# Patient Record
Sex: Female | Born: 1960 | Race: White | Hispanic: No | Smoking: Never smoker
Health system: Southern US, Community
[De-identification: ages and names within clinical notes are randomized; demographics above are authoritative.]

## PROBLEM LIST (undated history)

## (undated) DIAGNOSIS — IMO0002 Reserved for concepts with insufficient information to code with codable children: Secondary | ICD-10-CM

## (undated) DIAGNOSIS — N644 Mastodynia: Secondary | ICD-10-CM

## (undated) DIAGNOSIS — F329 Major depressive disorder, single episode, unspecified: Secondary | ICD-10-CM

## (undated) DIAGNOSIS — E785 Hyperlipidemia, unspecified: Secondary | ICD-10-CM

## (undated) DIAGNOSIS — F32A Depression, unspecified: Secondary | ICD-10-CM

## (undated) DIAGNOSIS — N289 Disorder of kidney and ureter, unspecified: Secondary | ICD-10-CM

## (undated) DIAGNOSIS — I639 Cerebral infarction, unspecified: Secondary | ICD-10-CM

## (undated) DIAGNOSIS — N393 Stress incontinence (female) (male): Secondary | ICD-10-CM

## (undated) DIAGNOSIS — N816 Rectocele: Secondary | ICD-10-CM

## (undated) DIAGNOSIS — C801 Malignant (primary) neoplasm, unspecified: Secondary | ICD-10-CM

## (undated) DIAGNOSIS — G4700 Insomnia, unspecified: Secondary | ICD-10-CM

## (undated) HISTORY — PX: VAGINAL HYSTERECTOMY: SUR661

## (undated) HISTORY — DX: Major depressive disorder, single episode, unspecified: F32.9

## (undated) HISTORY — DX: Depression, unspecified: F32.A

## (undated) HISTORY — DX: Mastodynia: N64.4

## (undated) HISTORY — PX: CYSTECTOMY: SUR359

## (undated) HISTORY — DX: Hyperlipidemia, unspecified: E78.5

## (undated) HISTORY — PX: KNEE SURGERY: SHX244

## (undated) HISTORY — DX: Stress incontinence (female) (male): N39.3

## (undated) HISTORY — DX: Insomnia, unspecified: G47.00

## (undated) HISTORY — DX: Rectocele: N81.6

## (undated) HISTORY — PX: REDUCTION MAMMAPLASTY: SUR839

## (undated) HISTORY — PX: ANTERIOR AND POSTERIOR VAGINAL REPAIR: SUR5

## (undated) HISTORY — PX: PUBOVAGINAL SLING: SHX1035

## (undated) HISTORY — PX: VULVA /PERINEUM BIOPSY: SHX319

## (undated) HISTORY — PX: SHOULDER SURGERY: SHX246

## (undated) HISTORY — PX: BACK SURGERY: SHX140

## (undated) HISTORY — DX: Reserved for concepts with insufficient information to code with codable children: IMO0002

## (undated) HISTORY — DX: Cerebral infarction, unspecified: I63.9

---

## 2004-09-04 ENCOUNTER — Emergency Department: Payer: Self-pay | Admitting: General Practice

## 2007-08-18 ENCOUNTER — Other Ambulatory Visit: Payer: Self-pay

## 2007-08-18 ENCOUNTER — Emergency Department: Payer: Self-pay | Admitting: Emergency Medicine

## 2008-03-18 ENCOUNTER — Other Ambulatory Visit (HOSPITAL_COMMUNITY): Admission: RE | Admit: 2008-03-18 | Discharge: 2008-04-03 | Payer: Self-pay | Admitting: Psychiatry

## 2008-03-20 ENCOUNTER — Ambulatory Visit: Payer: Self-pay | Admitting: Psychiatry

## 2008-04-04 ENCOUNTER — Ambulatory Visit (HOSPITAL_COMMUNITY): Payer: Self-pay | Admitting: Psychiatry

## 2008-04-24 ENCOUNTER — Ambulatory Visit (HOSPITAL_COMMUNITY): Payer: Self-pay | Admitting: Psychiatry

## 2008-05-08 ENCOUNTER — Ambulatory Visit (HOSPITAL_COMMUNITY): Payer: Self-pay | Admitting: Psychiatry

## 2010-07-29 ENCOUNTER — Ambulatory Visit: Payer: Self-pay | Admitting: Family Medicine

## 2011-02-15 ENCOUNTER — Ambulatory Visit: Payer: Self-pay | Admitting: General Surgery

## 2011-02-17 LAB — PATHOLOGY REPORT

## 2011-06-12 ENCOUNTER — Emergency Department: Payer: Self-pay | Admitting: *Deleted

## 2012-04-26 ENCOUNTER — Ambulatory Visit: Payer: Self-pay | Admitting: Gastroenterology

## 2013-05-23 DIAGNOSIS — R339 Retention of urine, unspecified: Secondary | ICD-10-CM | POA: Insufficient documentation

## 2013-05-23 DIAGNOSIS — N393 Stress incontinence (female) (male): Secondary | ICD-10-CM | POA: Insufficient documentation

## 2013-05-23 DIAGNOSIS — R351 Nocturia: Secondary | ICD-10-CM | POA: Insufficient documentation

## 2013-05-30 ENCOUNTER — Ambulatory Visit: Payer: Self-pay | Admitting: Urology

## 2013-05-30 LAB — BASIC METABOLIC PANEL
Anion Gap: 5 — ABNORMAL LOW (ref 7–16)
BUN: 23 mg/dL — ABNORMAL HIGH (ref 7–18)
CHLORIDE: 104 mmol/L (ref 98–107)
CREATININE: 1 mg/dL (ref 0.60–1.30)
Calcium, Total: 9.2 mg/dL (ref 8.5–10.1)
Co2: 28 mmol/L (ref 21–32)
EGFR (African American): >60
Glucose: 88 mg/dL (ref 65–99)
Osmolality: 277 (ref 275–301)
POTASSIUM: 3.9 mmol/L (ref 3.5–5.1)
SODIUM: 137 mmol/L (ref 136–145)

## 2013-05-30 LAB — CBC
HCT: 40 % (ref 35.0–47.0)
HGB: 13.5 g/dL (ref 12.0–16.0)
MCH: 32.1 pg (ref 26.0–34.0)
MCHC: 33.6 g/dL (ref 32.0–36.0)
MCV: 96 fL (ref 80–100)
PLATELETS: 407 10*3/uL (ref 150–440)
RBC: 4.19 10*6/uL (ref 3.80–5.20)
RDW: 14.1 % (ref 11.5–14.5)
WBC: 8.4 10*3/uL (ref 3.6–11.0)

## 2013-06-05 ENCOUNTER — Ambulatory Visit: Payer: Self-pay | Admitting: Urology

## 2013-06-06 LAB — HEMOGLOBIN: HGB: 11.7 g/dL — AB (ref 12.0–16.0)

## 2013-06-08 LAB — PATHOLOGY REPORT

## 2013-07-18 DIAGNOSIS — E785 Hyperlipidemia, unspecified: Secondary | ICD-10-CM | POA: Insufficient documentation

## 2013-07-18 DIAGNOSIS — F329 Major depressive disorder, single episode, unspecified: Secondary | ICD-10-CM | POA: Insufficient documentation

## 2013-07-18 DIAGNOSIS — F32A Depression, unspecified: Secondary | ICD-10-CM | POA: Insufficient documentation

## 2013-11-08 DIAGNOSIS — C801 Malignant (primary) neoplasm, unspecified: Secondary | ICD-10-CM

## 2013-11-08 HISTORY — DX: Malignant (primary) neoplasm, unspecified: C80.1

## 2014-06-01 NOTE — Op Note (Signed)
PATIENT NAME:  Felicia Cantu, DECAIRE MR#:  [id number] DATE OF BIRTH:  1960/05/19  DATE OF PROCEDURE:  06/05/2013  PREOPERATIVE DIAGNOSES: 1.  Pelvic organ prolapse.  2.  Stress urinary incontinence.   POSTOPERATIVE DIAGNOSES: 1.  Rectocele.  2.  Stress urinary incontinence.   OPERATIVE PROCEDURE:   1.  Transvaginal hysterectomy, left salpingo-oophorectomy and right salpingectomy.  2.  Pubovaginal sling with cystoscopy.   SURGEON:  Dr. Enzo Bi.   FIRST ASSISTANT:  Dr. Jacqlyn Larsen.   SECOND ASSISTANT:  Lucile Shutters, PA-S.    ANESTHESIA:  General endotracheal.   INDICATIONS:  The patient is a 54 year old white female with pelvic organ prolapse and stress urinary incontinence who presents for definitive surgery.   FINDINGS AT SURGERY:  Revealed a large rectocele, mild uterine prolapse and minimal cystocele. The ovaries appeared normal.   DESCRIPTION OF PROCEDURE:  The patient was brought to the operating room where she was placed in the supine position. General endotracheal anesthesia was induced without difficulty. She was placed in the dorsal lithotomy position using the candy cane stirrups. A Betadine perineal, intravaginal prep and drape was performed in standard fashion. A Foley catheter was placed and was draining clear yellow urine from the bladder. After completing the timeout, the procedure was started. A weighted speculum was placed into the vagina. A double-tooth tenaculum was placed onto the cervix. Posterior colpotomy was made with Mayo scissors. Uterosacral ligaments were clamped, cut and stick tied using 0 Vicryl suture. The cervix was circumscribed with the Bovie cautery. The bladder was dissected off the lower uterine segment through sharp and blunt dissection. Sequentially, the cardinal broad ligament complexes were then clamped, cut and stick tied up to the level of the utero-ovarian ligaments. These were then crossclamped, cut and stick tied. The specimen was removed from the  operative field. The fallopian tubes were then identified with a mesosalpinx on the right fallopian tube crossclamped and cut. A suture ligature was placed for hemostasis. The left fallopian tube was poorly  mobilized and in the process of isolating the structure was avulsed. A clamp was placed across the mesosalpinx and suture ligature was placed. However, some persistent bleeding was noted in the vasculature and eventually decision was made remove the left ovary in order to facilitate hemostasis. The infundibulopelvic ligament was isolated, crossclamped and the ovary was excised. The pedicle was suture ligated using 0 Vicryl stitch. An additional free tie was also placed in order to help to optimize hemostasis. Within the oozing mesosalpinx region on the left, a running locking stitch of 0 Vicryl was also used to help facilitate hemostasis. Once satisfied with hemostasis, the peritoneum was reapproximated using a pursestring stitch of 0 Vicryl.   The pubovaginal sling was then performed. Please see Dr. Aaron Edelman Cope's note for details of the sling procedure and cystoscopy.   Next, the rectocele was repaired in standard fashion. Two Allis clamps were used to place at the angles of the introitus. A diamond-shaped wedge of tissue was removed from the perineum and introitus. This was followed by undermining of the vaginal mucosa posteriorly with Metzenbaum scissors with subsequent incising of the vagina in the midline. Allis-Adair retractors were used to facilitate exposure. The perirectal fascia was dissected off of the vagina through sharp and blunt dissection. Once adequately mobilized, the rectocele was reduced using horizontal mattress sutures of 0 Vicryl. A good shelf was created within the overlying plicated levator complexes.  Following this, the vagina was trimmed and then reapproximated in the midline using simple  interrupted sutures of 2-0 chromic. Once the posterior colporrhaphy was completed, the  remainder of the cystoscopy was performed by Dr. Jacqlyn Larsen. This was followed by placing a vaginal pack within the vagina. Upon completion of the procedure, all instrumentation was removed. The patient was awakened, extubated and taken to the recovery room in satisfactory condition. Estimated blood loss was 450 mL.  IV fluids were 1400 mL.  Urine was not quantified because of the cystoscopy procedures. The patient did receive gentamicin and clindamycin antibiotic prophylaxis. All instruments, needle and sponge counts were verified as correct.     ____________________________ Alanda Slim. Devyn Sheerin, MD mad:dmm D: 06/05/2013 21:30:03 ET T: 06/05/2013 22:13:02 ET JOB#: [id number]  cc: Hassell Done A. Tinya Cadogan, MD, <Dictator> [facility] MD ELECTRONICALLY SIGNED 06/12/2013 13:43

## 2014-06-01 NOTE — Op Note (Signed)
PATIENT NAME:  Felicia Cantu, Felicia Cantu MR#:  [id number] DATE OF BIRTH:  Jun 24, 1960  DATE OF PROCEDURE:  06/05/2013  PRINCIPAL DIAGNOSIS:  Stress urinary incontinence.   POSTOPERATIVE DIAGNOSIS: Stress urinary incontinence.   PROCEDURE: Pubovaginal sling.   SURGEON: Edrick Oh, M.D.   ASSISTANT: Malachi Paradise, M.D.   ANESTHESIA: General endotracheal anesthesia.   INDICATIONS FOR PROCEDURE: The patient is a 54 year old female with a history of stress component urinary incontinence. She is scheduled to undergo a vaginal hysterectomy and rectocele repair. We have elected to proceed with concurrent surgery for correction of the stress component incontinence. She presents today for this purpose.   DESCRIPTION OF PROCEDURE: After informed consent was obtained, the patient was taken to the operating room and placed in the dorsal lithotomy position under general endotracheal anesthesia. The patient was then prepped and draped in the usual standard fashion. She underwent the vaginal hysterectomy with bilateral salpingo-oophorectomy by Dr. Enzo Bi with my assistance. At the completion of the procedure, a small anterior transverse vaginal wall incision was made at the level of the mid urethra. The vaginal mucosa was dissected off of the underlying tissue. This was taken down to the endopelvic fascia bilaterally. The pubic bone could be easily identified. Marks were made at the lateral aspect of the symphysis bilaterally. Small skin incisions were made at this level. A Supris  needle was passed through the skin incision. It was advanced to the level of the pubic bone. The back edge of the pubic bone was identified. The fascia was entered. The needle was advanced along the posterior aspect of the symphysis to the vaginal incision site. This was passed with finger guidance. The needle was easily placed into position. It was brought through the vaginal incision site. The left needle was similarly placed. With  the needles in place, the segment of mesh was secured to the needles. The needles were then withdrawn to the anterior abdominal wall. The mesh was centered over the mid urethra. It was secured at the 6 o'clock and 12 o'clock positions with interrupted 4-0 Vicryl suture. The vaginal incision was then closed. Cystoscopy was performed with a 22-French rigid cystoscope. This demonstrated no significant urethral abnormalities. There was no evidence of any needle or mesh passage into the urinary bladder. Clear efflux was noted from both ureteral orifices. No other significant mucosal abnormalities were appreciated. A Foley catheter was replaced to gravity drainage for the remainder of the procedure. The vaginal cuff from the hysterectomy and the posterior repair were then performed, also under the direction of Dr. Enzo Bi. Upon completion of those procedures, the Foley catheter was removed. Cystoscopy was once again performed with a 22-French rigid cystoscope. The bladder was filled to approximately 500 mL. The scope was withdrawn with slight leakage noted. Gentle pressure was applied to the segment of mesh with complete resolution of the leakage. The scope was replaced. There was no evidence of any kinking or significant compression of the urethra. Gentle pressure was placed on the scope with slight withdrawal of the mesh material. This once again resulted in slight leakage. The mesh was elevated just to the point of minimal trickle. The edges were trimmed free at the level of the skin. Dermabond was applied to the skin incision sites. The Foley catheter was replaced to gravity drainage. A vaginal packing was placed. The patient was then returned to the supine position. She was awakened from general endotracheal anesthesia. She was taken to the recovery room in stable condition. There were no problems  or complications. The patient tolerated the procedure well.   ESTIMATED BLOOD LOSS: Minimal from the sling  standpoint. The other blood loss will be mentioned under the hysterectomy and rectocele dictation.   ____________________________ Felicia Bors Jacqlyn Larsen, MD bsc:ce D: 06/06/2013 13:27:07 ET T: 06/06/2013 15:22:33 ET JOB#: [id number]  cc: Felicia Bors. Jacqlyn Larsen, MD, <Dictator> Alanda Slim. DeFrancesco, MD Felicia Bors Burnett Spray MD ELECTRONICALLY SIGNED 06/14/2013 20:47

## 2014-06-22 ENCOUNTER — Encounter: Payer: Self-pay | Admitting: Emergency Medicine

## 2014-06-22 ENCOUNTER — Emergency Department
Admission: EM | Admit: 2014-06-22 | Discharge: 2014-06-22 | Disposition: A | Discharge status: Home / Self Care | Payer: BLUE CROSS/BLUE SHIELD | Attending: Emergency Medicine | Admitting: Emergency Medicine

## 2014-06-22 DIAGNOSIS — R319 Hematuria, unspecified: Secondary | ICD-10-CM | POA: Diagnosis present

## 2014-06-22 DIAGNOSIS — N3000 Acute cystitis without hematuria: Secondary | ICD-10-CM | POA: Diagnosis not present

## 2014-06-22 DIAGNOSIS — Z880 Allergy status to penicillin: Secondary | ICD-10-CM | POA: Diagnosis not present

## 2014-06-22 HISTORY — DX: Disorder of kidney and ureter, unspecified: N28.9

## 2014-06-22 HISTORY — DX: Malignant (primary) neoplasm, unspecified: C80.1

## 2014-06-22 LAB — URINALYSIS COMPLETE WITH MICROSCOPIC (ARMC ONLY)
BILIRUBIN URINE: NEGATIVE
GLUCOSE, UA: NEGATIVE mg/dL
HGB URINE DIPSTICK: NEGATIVE
KETONES UR: NEGATIVE mg/dL
Nitrite: NEGATIVE
PROTEIN: NEGATIVE mg/dL
Specific Gravity, Urine: 1.003 — ABNORMAL LOW (ref 1.005–1.030)
pH: 6 (ref 5.0–8.0)

## 2014-06-22 MED ORDER — SULFAMETHOXAZOLE-TRIMETHOPRIM 800-160 MG PO TABS: 1.0000 | ORAL_TABLET | Freq: Two times a day (BID) | ORAL | Status: DC

## 2014-06-22 MED ORDER — PHENAZOPYRIDINE HCL 200 MG PO TABS
200.0000 mg | ORAL_TABLET | Freq: Once | ORAL | Status: AC
  Administered 2014-06-22: 200 mg via ORAL

## 2014-06-22 MED ORDER — PHENAZOPYRIDINE HCL 200 MG PO TABS: 200.0000 mg | ORAL_TABLET | Freq: Three times a day (TID) | ORAL | Status: DC | PRN

## 2014-06-22 MED ORDER — KETOROLAC TROMETHAMINE 10 MG PO TABS: 10.0000 mg | ORAL_TABLET | Freq: Four times a day (QID) | ORAL | Status: DC | PRN

## 2014-06-22 MED ORDER — KETOROLAC TROMETHAMINE 10 MG PO TABS
ORAL_TABLET | ORAL | Status: AC
  Administered 2014-06-22: 10 mg via ORAL
  Filled 2014-06-22: qty 1

## 2014-06-22 MED ORDER — SULFAMETHOXAZOLE-TRIMETHOPRIM 800-160 MG PO TABS
ORAL_TABLET | ORAL | Status: AC
  Administered 2014-06-22: 1 via ORAL
  Filled 2014-06-22: qty 1

## 2014-06-22 MED ORDER — SULFAMETHOXAZOLE-TRIMETHOPRIM 800-160 MG PO TABS
1.0000 | ORAL_TABLET | Freq: Once | ORAL | Status: AC
  Administered 2014-06-22: 1 via ORAL

## 2014-06-22 MED ORDER — PHENAZOPYRIDINE HCL 200 MG PO TABS
ORAL_TABLET | ORAL | Status: AC
  Administered 2014-06-22: 200 mg via ORAL
  Filled 2014-06-22: qty 1

## 2014-06-22 MED ORDER — KETOROLAC TROMETHAMINE 10 MG PO TABS
10.0000 mg | ORAL_TABLET | Freq: Once | ORAL | Status: AC
  Administered 2014-06-22: 10 mg via ORAL

## 2014-06-22 NOTE — ED Provider Notes (Signed)
[facility] Emergency Department Provider Note ____________________________________________  Time seen: 72  I have reviewed the triage vital signs and the nursing notes.   HISTORY  Chief Complaint Hematuria   HPI Felicia Cantu is a 54 y.o. female complaining of urinary frequency and burning pain and some blood in her urine states that she has a history of urinary tract infections she is been drinking plenty of fluids and cranberry juice to try to get it out but is still symptomatic and is here today for evaluation because she believes that she needs antibiotics rates the pain at its worse is a 10 out of 10 currently not so bad with nothing really factoring in his pain comes and goes denies any nausea fevers chills vomiting any bowel complaints complaints or vaginal issues   Past Medical History  Diagnosis Date  . Renal disorder   . 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   Past Surgical History  Procedure Laterality Date  . Abdominal hysterectomy    . Vulva /perineum biopsy    . Cystectomy      Current Outpatient Rx  Name  Route  Sig  Dispense  Refill  . ketorolac (TORADOL) 10 MG tablet   Oral   Take 1 tablet (10 mg total) by mouth every 6 (six) hours as needed.   20 tablet   0   . phenazopyridine (PYRIDIUM) 200 MG tablet   Oral   Take 1 tablet (200 mg total) by mouth 3 (three) times daily as needed for pain.   10 tablet   0   . sulfamethoxazole-trimethoprim (BACTRIM DS,SEPTRA DS) 800-160 MG per tablet   Oral   Take 1 tablet by mouth 2 (two) times daily.   10 tablet   0     Allergies Codeine; Penicillins; and Vicodin  History reviewed. No pertinent family history.  Social History History  Substance Use Topics  . Smoking status: Never Smoker   . Smokeless tobacco: Never Used  . Alcohol Use: No    Review of Systems Constitutional: No fever/chills Eyes: No  visual changes. ENT: No sore throat. Cardiovascular: Denies chest pain. Respiratory: Denies shortness of breath. Gastrointestinal: No abdominal pain.  No nausea, no vomiting.  No diarrhea.  No constipation. Genitourinary: Negative for dysuria. Musculoskeletal: Negative for back pain. Skin: Negative for rash. Neurological: Negative for headaches, focal weakness or numbness.  10-point ROS otherwise negative.  ____________________________________________   PHYSICAL EXAM:  VITAL SIGNS: ED Triage Vitals  Enc Vitals Group     BP 06/22/14 1807 132/106 mmHg     Pulse Rate 06/22/14 1807 73     Resp 06/22/14 1807 16     Temp 06/22/14 1807 98.2 F (36.8 C)     Temp Source 06/22/14 1807 Oral     SpO2 06/22/14 1807 99 %     Weight 06/22/14 1807 192 lb (87.091 kg)     Height 06/22/14 1807 5\' 8"  (1.727 m)     Head Cir --      Peak Flow --      Pain Score 06/22/14 1808 10     Pain Loc --      Pain Edu? --      Excl. in [place]? --     Constitutional: Alert and oriented. Well appearing and in no acute distress. Eyes: Conjunctivae are normal. PERRL. EOMI. Head: Atraumatic. Nose: No congestion/rhinnorhea. Mouth/Throat: Mucous membranes are  moist.  Oropharynx non-erythematous. Neck: No stridor.   Cardiovascular: Normal rate, regular rhythm. Grossly normal heart sounds.  Good peripheral circulation. Respiratory: Normal respiratory effort.  No retractions. Lungs CTAB. Gastrointestinal: Mild suprapubic tenderness Musculoskeletal: No lower extremity tenderness nor edema.  No joint effusions. Neurologic:  Normal speech and language. No gross focal neurologic deficits are appreciated. Speech is normal. No gait instability. Skin:  Skin is warm, dry and intact. No rash noted. Psychiatric: Mood and affect are normal. Speech and behavior are normal.  ____________________________________________   LABS (all labs ordered are listed, but only abnormal results are displayed)  Labs Reviewed   URINALYSIS COMPLETEWITH MICROSCOPIC ([place])  - Abnormal; Notable for the following:    Color, Urine STRAW (*)    APPearance CLEAR (*)    Specific Gravity, Urine 1.003 (*)    Leukocytes, UA 1+ (*)    Bacteria, UA RARE (*)    Squamous Epithelial / LPF 0-5 (*)    All other components within normal limits      PROCEDURES  Procedure(s) performed: None  Critical Care performed: No  ____________________________________________   INITIAL IMPRESSION / ASSESSMENT AND PLAN / ED COURSE  Pertinent labs & imaging results that were available during my care of the patient were reviewed by me and considered in my medical decision making (see chart for details).  Urinary tract infection given the patient's description of symptoms or history of having them before the fact that she is then vigorously drinking a lot of water we'll go ahead and cover with antibiotics Pyridium and follow-up with her doctor return here for any acute concerns or worsening symptoms ____________________________________________   FINAL CLINICAL IMPRESSION(S) / ED DIAGNOSES  Final diagnoses:  Acute cystitis without hematuria     Evangela Heffler Verdene Rio, PA-C 06/22/14 2005  Delman Kitten, MD 06/22/14 [phone]

## 2014-06-22 NOTE — Discharge Instructions (Signed)

## 2014-06-22 NOTE — ED Notes (Signed)
Pt c/o of heavy bleeding in urine since Tuesday morning, including clots.  She has bladder pain and burning upon urination.  Pain is rated as 10/10 and described as "fiery darts of hell".  She has taken cranberry supplements but no OTC or prescription pain medications.  She states that she has had several bladder infections in the past and this seems like the same.

## 2014-11-06 ENCOUNTER — Encounter: Payer: Self-pay | Admitting: Obstetrics and Gynecology

## 2014-11-20 ENCOUNTER — Encounter: Payer: BLUE CROSS/BLUE SHIELD | Admitting: Obstetrics and Gynecology

## 2015-01-29 ENCOUNTER — Encounter: Payer: Self-pay | Admitting: Obstetrics and Gynecology

## 2015-01-29 ENCOUNTER — Ambulatory Visit (INDEPENDENT_AMBULATORY_CARE_PROVIDER_SITE_OTHER): Payer: BLUE CROSS/BLUE SHIELD | Admitting: Obstetrics and Gynecology

## 2015-01-29 VITALS — BP 106/70 | HR 89 | Ht 68.0 in | Wt 189.4 lb

## 2015-01-29 DIAGNOSIS — Z1211 Encounter for screening for malignant neoplasm of colon: Secondary | ICD-10-CM

## 2015-01-29 DIAGNOSIS — Z0000 Encounter for general adult medical examination without abnormal findings: Secondary | ICD-10-CM

## 2015-01-29 DIAGNOSIS — Z90710 Acquired absence of both cervix and uterus: Secondary | ICD-10-CM

## 2015-01-29 DIAGNOSIS — Z1231 Encounter for screening mammogram for malignant neoplasm of breast: Secondary | ICD-10-CM

## 2015-01-29 DIAGNOSIS — Z01419 Encounter for gynecological examination (general) (routine) without abnormal findings: Secondary | ICD-10-CM

## 2015-01-29 NOTE — Progress Notes (Signed)
Patient ID: Felicia Cantu, female   DOB: 07/05/1960, 54 y.o.   MRN: [id number] ANNUAL PREVENTATIVE CARE GYN  ENCOUNTER NOTE  Subjective:       Felicia Cantu is a 54 y.o. G1P0 female here for a routine annual gynecologic exam.  Current complaints: 1.  none    Gynecologic History No LMP recorded. Patient has had a hysterectomy. Contraception: status post hysterectomy TVH bilateral salpingectomy Last Pap: 2015. Results were: asus/neg Last mammogram: 2015. Results were: normal Status post anterior colporrhaphy. Status post posterior colporrhaphy. Status post pubovaginal sling (Dr. Jacqlyn Larsen). Long history of chronic left breast tenderness; previous biopsy negative. History of SUI, minimally dramatic now. Bowel function normal  Obstetric History OB History  Gravida Para Term Preterm AB SAB TAB Ectopic Multiple Living  1             # Outcome Date GA Lbr Len/2nd Weight Sex Delivery Anes PTL Lv  1 Gravida               Past Medical History  Diagnosis Date  . Renal disorder   . Depression   . Positional vertigo   . Hyperlipemia   . Insomnia   . Breast tenderness   . Rectocele   . SUI (stress urinary incontinence, female)   . 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 Vulva /perineum biopsy    . Cystectomy    . Pubovaginal sling    . Vaginal hysterectomy      tvh lso rt salpingectomy   . Anterior and posterior vaginal repair    . Shoulder surgery    . Knee surgery    . Back surgery      No current outpatient prescriptions on file prior to visit.   No current facility-administered medications on file prior to visit.    Allergies  Allergen Reactions  . Codeine Nausea And Vomiting  . Penicillins Nausea And Vomiting  . Vicodin [Hydrocodone-Acetaminophen] Nausea And Vomiting  . Propoxyphene Nausea And Vomiting and Nausea Only    Social History   Social History  . Marital Status: Divorced   Spouse Name: N/A  . Number of Children: N/A  . Years of Education: N/A   Occupational History  . Not on file.   Social History Main Topics  . Smoking status: Never Smoker   . Smokeless tobacco: Never Used  . Alcohol Use: No  . Drug Use: No  . Sexual Activity: Not Currently    Birth Control/ Protection: Surgical   Other Topics Concern  . Not on file   Social History Narrative    Family History  Problem Relation Age of Onset  . Diabetes Mother   . Heart disease Father   . Cancer Neg Hx     The following portions of the patient's history were reviewed and updated as appropriate: allergies, current medications, past family history, past medical history, past social history, past surgical history and problem list.  Review of Systems ROS Review of Systems - General ROS: negative for - chills, fatigue, fever, hot flashes, night sweats, weight gain or weight loss Psychological ROS: negative for - anxiety, decreased libido, depression, mood swings, physical abuse or sexual abuse Ophthalmic ROS: negative for - blurry vision, eye pain or loss of vision ENT ROS: negative for - headaches, hearing change, visual changes or vocal changes Allergy and Immunology ROS: negative for - hives, itchy/watery eyes or seasonal allergies  Hematological and Lymphatic ROS: negative for - bleeding problems, bruising, swollen lymph nodes or weight loss Endocrine ROS: negative for - galactorrhea, hair pattern changes, hot flashes, malaise/lethargy, mood swings, palpitations, polydipsia/polyuria, skin changes, temperature intolerance or unexpected weight changes Breast ROS: negative for - new or changing breast lumps or nipple discharge Respiratory ROS: negative for - cough or shortness of breath Cardiovascular ROS: negative for - chest pain, irregular heartbeat, palpitations or shortness of breath Gastrointestinal ROS: no abdominal pain, change in bowel habits, or black or bloody stools Genito-Urinary ROS:  no dysuria, trouble voiding, or hematuria Musculoskeletal ROS: negative for - joint pain or joint stiffness Neurological ROS: negative for - bowel and bladder control changes Dermatological ROS: negative for rash and skin lesion changes   Objective:   BP 106/70 mmHg  Pulse 89  Ht 5\' 8"  (1.727 m)  Wt 189 lb 6.4 oz (85.911 kg)  BMI 28.80 kg/m2 CONSTITUTIONAL: Well-developed, well-nourished female in no acute distress.  PSYCHIATRIC: Normal mood and affect. Normal behavior. Normal judgment and thought content. [place]: Alert and oriented to person, place, and time. Normal muscle tone coordination. No cranial nerve deficit noted. HENT:  Normocephalic, atraumatic, External right and left ear normal. Oropharynx is clear and moist EYES: Conjunctivae and EOM are normal. Pupils are equal, round, and reactive to light. No scleral icterus.  NECK: Normal range of motion, supple, no masses.  Normal thyroid.  SKIN: Skin is warm and dry. No rash noted. Not diaphoretic. No erythema. No pallor. CARDIOVASCULAR: Normal heart rate noted, regular rhythm, no murmur. RESPIRATORY: Clear to auscultation bilaterally. Effort and breath sounds normal, no problems with respiration noted. BREASTS: Symmetric in size. No masses, skin changes, nipple drainage, or lymphadenopathy. Left breast tenderness upper outer quadrant, without focal findings ABDOMEN: Soft, normal bowel sounds, no distention noted.  No tenderness, rebound or guarding.  BLADDER: Normal PELVIC:  External Genitalia: Normal  BUS: Normal  Vagina: Normal  Cervix:surgically absent  Uterus: surgically absent  Adnexa: Normal  RV: External Exam NormaI, No Rectal Masses and Normal Sphincter tone  MUSCULOSKELETAL: Normal range of motion. No tenderness.  No cyanosis, clubbing, or edema.  2+ distal pulses. LYMPHATIC: No Axillary, Supraclavicular, or Inguinal Adenopathy.    Assessment:   Annual gynecologic examination 54 y.o. Contraception: status post  hysterectomy TVH, bilateral salpingectomy. Status post anterior posterior colporrhaphy. Status post pubovaginal sling bmi- 28   Plan:  Pap: Not needed Mammogram: Ordered Stool Guaiac Testing:  Ordered Labs: thur pcp Routine preventative health maintenance measures emphasized: Exercise/Diet/Weight control, Tobacco Warnings and Alcohol/Substance use risks Return to [place], CMA  Brayton Mars, MD  Note: This dictation was prepared with Dragon dictation along with smaller phrase technology. Any transcriptional errors that result from this process are unintentional.

## 2015-01-29 NOTE — Patient Instructions (Signed)
No Pap smear needed. Mammogram ordered. Stool guaiac cards given for colon cancer screening. Return in 1 year or when necessary

## 2015-03-21 ENCOUNTER — Encounter: Payer: Self-pay | Admitting: Surgery

## 2015-03-21 ENCOUNTER — Ambulatory Visit (INDEPENDENT_AMBULATORY_CARE_PROVIDER_SITE_OTHER): Payer: BLUE CROSS/BLUE SHIELD | Admitting: Surgery

## 2015-03-21 VITALS — BP 143/91 | HR 70 | Temp 98.3°F | Wt 198.0 lb

## 2015-03-21 DIAGNOSIS — N6320 Unspecified lump in the left breast, unspecified quadrant: Secondary | ICD-10-CM

## 2015-03-21 DIAGNOSIS — N644 Mastodynia: Secondary | ICD-10-CM | POA: Diagnosis not present

## 2015-03-21 DIAGNOSIS — N62 Hypertrophy of breast: Secondary | ICD-10-CM | POA: Diagnosis not present

## 2015-03-21 DIAGNOSIS — N63 Unspecified lump in breast: Secondary | ICD-10-CM

## 2015-03-21 NOTE — Progress Notes (Signed)
Subjective:     Patient ID: Felicia Cantu, female   DOB: 13-Oct-1960, 55 y.o.   MRN: [id number]  HPI  55 year old female has a past medical history of hyperlipidemia but otherwise healthy comes in today with complaints of left breast pain which has been ongoing for the past 20 years. Patient states that one particular area on the left upper outer quadrant is painful and has been biopsied in the past and shown to be benign fibrocystic disease back in 2013. Patient does not have any family history of breast cancer although her father did have colon cancer and her mother had stomach cancer. As well as her sisters having some skin cancer. 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 Patient started her first menses at the age of 32 years she took birth control for about 15 years and then had her first child at the age of 64 and she did breast-feed. She has not had any hormone replacement therapy therapy but she has had a hysterectomy and her right ovary removed. Patient has not noticed any skin changes any nipple retraction nipple discharge or any other lumps or bumps in the area. Patient does note that she has gained some weight over the past 5 years approximately 40 pounds. Prior to this the patient had tried to get a breast reduction patient states that she has significant shoulder pain back pain from the size of her breast. 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   Past Medical History  Diagnosis Date  . Renal disorder   . Depression   . Positional vertigo   . Hyperlipemia   . Insomnia   . Breast tenderness   . Rectocele   . SUI (stress urinary incontinence, female)   . 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 Stroke [facility])    Past Surgical History  Procedure Laterality Date  . Vulva /perineum biopsy    . Cystectomy    . Pubovaginal sling    . Vaginal  hysterectomy      tvh lso rt salpingectomy   . Anterior and posterior vaginal repair    . Shoulder surgery    . Knee surgery    . Back surgery     Family History  Problem Relation Age of Onset  . Diabetes Mother   . Cancer Mother     skin cancer  . Heart disease Father   . Cancer Father     lung   Social History   Social History  . Marital Status: Divorced    Spouse Name: N/A  . Number of Children: N/A  . Years of Education: N/A   Social History Main Topics  . Smoking status: Never Smoker   . Smokeless tobacco: Never Used  . Alcohol Use: No  . Drug Use: No  . Sexual Activity: Not Currently    Birth Control/ Protection: Surgical   Other Topics Concern  . None   Social History Narrative    Current outpatient prescriptions:  .  aspirin 81 MG chewable tablet, Chew by mouth., Disp: , Rfl:  .  lovastatin (MEVACOR) 40 MG tablet, , Disp: , Rfl:  .  Melatonin 3 MG TABS, Take by mouth., Disp: , Rfl:  .  Multiple Vitamin (MULTI-VITAMINS) TABS, Take by mouth., Disp: , Rfl:  .  sertraline (ZOLOFT) 100 MG tablet, 200  mg. , Disp: , Rfl:  Allergies  Allergen Reactions  . Codeine Nausea And Vomiting  . Penicillins Nausea And Vomiting  . Vicodin [Hydrocodone-Acetaminophen] Nausea And Vomiting  . Propoxyphene Nausea And Vomiting and Nausea Only      Review of Systems  Constitutional: Negative for fever, activity change, appetite change and unexpected weight change.  HENT: Negative for congestion and sore throat.   Respiratory: Negative for cough, chest tightness, shortness of breath and wheezing.   Cardiovascular: Negative for chest pain, palpitations and leg swelling.  Gastrointestinal: Negative for nausea, vomiting, abdominal pain, diarrhea and anal bleeding.  Genitourinary: Negative for dysuria and hematuria.  Musculoskeletal: Positive for back pain, arthralgias and neck pain.  Skin: Negative for color change, pallor, rash and wound.  Neurological: Negative for  dizziness and weakness.  Hematological: Negative for adenopathy. Does not bruise/bleed easily.  Psychiatric/Behavioral: Negative for agitation. The patient is not nervous/anxious.        Filed Vitals:   03/21/15 1622  BP: 143/91  Pulse: 70  Temp: 98.3 F (36.8 C)    Objective:   Physical Exam  Constitutional: She appears well-developed and well-nourished. No distress.  HENT:  Head: Normocephalic and atraumatic.  Nose: Nose normal.  Mouth/Throat: Oropharynx is clear and moist. No oropharyngeal exudate.  Eyes: Conjunctivae and EOM are normal. Pupils are equal, round, and reactive to light. No scleral icterus.  Neck: Normal range of motion. Neck supple. No tracheal deviation present.  Cardiovascular: Normal rate, regular rhythm, normal heart sounds and intact distal pulses.  Exam reveals no gallop and no friction rub.   No murmur heard. Pulmonary/Chest: Effort normal and breath sounds normal. No respiratory distress. She has no wheezes. She has no rales.  Abdominal: Soft. Bowel sounds are normal. She exhibits no distension. There is no tenderness. There is no rebound.  Musculoskeletal: Normal range of motion. She exhibits no edema or tenderness.  Neurological: She is alert. No cranial nerve deficit.  Skin: Skin is warm and dry. No rash noted. No erythema. No pallor.  Psychiatric: She has a normal mood and affect. Her behavior is normal. Judgment and thought content normal.  Vitals reviewed.  Breast exam:   Right breast: very large F cup or larger breast which comes down below the umbilicus, with shoulder/clavicular grooving, dense breast tissue with multiple small areas of rubbery mobile tissues, likely fibrocystic disease.  No mass, skin changes, nipple retraction or lymphadenopathy.   Left breast: also very large F cup or larger which comes down below the umbilicus, with shoulder/clavicular grooving, dense breast tissue, multiple small areas of rubbery mobile tissues. Area in upper  outer quadrant approximately 1cm in size which is tender, feels like fibrocystic changes. No masses, skin changes, nipple retraction or lymphadenopathy.    Pathology report 05/2011: benign tissue with usual ductal hyperplasia no atypia or malignancy      Assessment:     55 yr old female with left breast pain and macromastomy     Plan:     I discussed with the female that given her past pathology and her clinical exam that I feel that she likely has fibrocystic disease which is causing some of her left breast pain. She is scheduled to have her mammogram in March which last year was a BI-RADS 2. 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 I will refer her to [place] department to consider her for a bilateral breast reduction as long as her mammogram comes back negative. If she does have some other findings on her mammogram will have her follow-up with [place] or if the plastics does not feel she is an appropriate candidate for reduction I'm happy to do excisional biopsy of the fibroadenoma.

## 2015-03-21 NOTE — Patient Instructions (Signed)
We will refer you to [facility]. They will contact you with date and time of your appointment.

## 2016-02-05 NOTE — Progress Notes (Deleted)
Patient ID: Felicia Cantu, female   DOB: [birth year], 55 y.o.   MRN: [id number] ANNUAL PREVENTATIVE CARE GYN  ENCOUNTER NOTE  Subjective:       Felicia Cantu is a 55 y.o. G1P0 female here for a routine annual gynecologic exam.  Current complaints: 1.  none    Gynecologic History No LMP recorded. Patient has had a hysterectomy. Contraception: status post hysterectomy TVH bilateral salpingectomy Last Pap: 2015. Results were: asus/neg Last mammogram: 2015. Results were: normal Status post anterior colporrhaphy. Status post posterior colporrhaphy. Status post pubovaginal sling (Dr. Jacqlyn Larsen). Long history of chronic left breast tenderness; previous biopsy negative. History of SUI, minimally dramatic now. Bowel function normal  Obstetric History OB History  Gravida Para Term Preterm AB Living  1            SAB TAB Ectopic Multiple Live Births               # Outcome Date GA Lbr Len/2nd Weight Sex Delivery Anes PTL Lv  1 Gravida               Past Medical History:  Diagnosis Date  . Breast tenderness   . 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 Depression   . Hyperlipemia   . Insomnia   . Positional vertigo   . Rectocele   . Renal disorder   . Stroke ([place])   . SUI (stress urinary incontinence, female)     Past Surgical History:  Procedure Laterality Date  . ANTERIOR AND POSTERIOR VAGINAL REPAIR    . BACK SURGERY    . CYSTECTOMY    . KNEE SURGERY    . PUBOVAGINAL SLING    . SHOULDER SURGERY    . VAGINAL HYSTERECTOMY     tvh lso rt salpingectomy   . VULVA /PERINEUM BIOPSY      Current Outpatient Prescriptions on File Prior to Visit  Medication Sig Dispense Refill  . aspirin 81 MG chewable tablet Chew by mouth.    . lovastatin (MEVACOR) 40 MG tablet     . Melatonin 3 MG TABS Take by mouth.    . Multiple Vitamin (MULTI-VITAMINS) TABS Take by mouth.    . sertraline (ZOLOFT) 100 MG tablet 200 mg.      No current facility-administered medications  on file prior to visit.     Allergies  Allergen Reactions  . Codeine Nausea And Vomiting  . Penicillins Nausea And Vomiting  . Vicodin [Hydrocodone-Acetaminophen] Nausea And Vomiting  . Propoxyphene Nausea And Vomiting and Nausea Only    Social History   Social History  . Marital status: Divorced    Spouse name: N/A  . Number of children: N/A  . Years of education: N/A   Occupational History  . Not on file.   Social History Main Topics  . Smoking status: Never Smoker  . Smokeless tobacco: Never Used  . Alcohol use No  . Drug use: No  . Sexual activity: Not Currently    Birth control/ protection: Surgical   Other Topics Concern  . Not on file   Social History Narrative  . No narrative on file    Family History  Problem Relation Age of Onset  . Diabetes Mother   . Cancer Mother     skin cancer  . Heart disease Father   . Cancer Father     lung    The following portions of the patient's history were reviewed and updated as appropriate:  allergies, current medications, past family history, past medical history, past social history, past surgical history and problem list.  Review of Systems ROS Review of Systems - General ROS: negative for - chills, fatigue, fever, hot flashes, night sweats, weight gain or weight loss Psychological ROS: negative for - anxiety, decreased libido, depression, mood swings, physical abuse or sexual abuse Ophthalmic ROS: negative for - blurry vision, eye pain or loss of vision ENT ROS: negative for - headaches, hearing change, visual changes or vocal changes Allergy and Immunology ROS: negative for - hives, itchy/watery eyes or seasonal allergies Hematological and Lymphatic ROS: negative for - bleeding problems, bruising, swollen lymph nodes or weight loss Endocrine ROS: negative for - galactorrhea, hair pattern changes, hot flashes, malaise/lethargy, mood swings, palpitations, polydipsia/polyuria, skin changes, temperature intolerance or  unexpected weight changes Breast ROS: negative for - new or changing breast lumps or nipple discharge Respiratory ROS: negative for - cough or shortness of breath Cardiovascular ROS: negative for - chest pain, irregular heartbeat, palpitations or shortness of breath Gastrointestinal ROS: no abdominal pain, change in bowel habits, or black or bloody stools Genito-Urinary ROS: no dysuria, trouble voiding, or hematuria Musculoskeletal ROS: negative for - joint pain or joint stiffness Neurological ROS: negative for - bowel and bladder control changes Dermatological ROS: negative for rash and skin lesion changes   Objective:   There were no vitals taken for this visit. CONSTITUTIONAL: Well-developed, well-nourished female in no acute distress.  PSYCHIATRIC: Normal mood and affect. Normal behavior. Normal judgment and thought content. [place]: Alert and oriented to person, place, and time. Normal muscle tone coordination. No cranial nerve deficit noted. HENT:  Normocephalic, atraumatic, External right and left ear normal. Oropharynx is clear and moist EYES: Conjunctivae and EOM are normal. Pupils are equal, round, and reactive to light. No scleral icterus.  NECK: Normal range of motion, supple, no masses.  Normal thyroid.  SKIN: Skin is warm and dry. No rash noted. Not diaphoretic. No erythema. No pallor. CARDIOVASCULAR: Normal heart rate noted, regular rhythm, no murmur. RESPIRATORY: Clear to auscultation bilaterally. Effort and breath sounds normal, no problems with respiration noted. BREASTS: Symmetric in size. No masses, skin changes, nipple drainage, or lymphadenopathy. Left breast tenderness upper outer quadrant, without focal findings ABDOMEN: Soft, normal bowel sounds, no distention noted.  No tenderness, rebound or guarding.  BLADDER: Normal PELVIC:  External Genitalia: Normal  BUS: Normal  Vagina: Normal  Cervix:surgically absent  Uterus: surgically absent  Adnexa: Normal  RV:  External Exam NormaI, No Rectal Masses and Normal Sphincter tone  MUSCULOSKELETAL: Normal range of motion. No tenderness.  No cyanosis, clubbing, or edema.  2+ distal pulses. LYMPHATIC: No Axillary, Supraclavicular, or Inguinal Adenopathy.    Assessment:   Annual gynecologic examination 55 y.o. Contraception: status post hysterectomy TVH, bilateral salpingectomy. Status post anterior posterior colporrhaphy. Status post pubovaginal sling bmi- 28   Plan:  Pap: Not needed Mammogram: Ordered Stool Guaiac Testing:  Ordered Labs: thur pcp Routine preventative health maintenance measures emphasized: Exercise/Diet/Weight control, Tobacco Warnings and Alcohol/Substance use risks Return to [place], [place]   Note: This dictation was prepared with Dragon dictation along with smaller phrase technology. Any transcriptional errors that result from this process are unintentional.

## 2016-02-11 ENCOUNTER — Encounter: Payer: BLUE CROSS/BLUE SHIELD | Admitting: Obstetrics and Gynecology

## 2017-07-15 ENCOUNTER — Other Ambulatory Visit: Payer: Self-pay | Admitting: Internal Medicine

## 2017-07-15 DIAGNOSIS — Z1239 Encounter for other screening for malignant neoplasm of breast: Secondary | ICD-10-CM

## 2020-04-19 ENCOUNTER — Other Ambulatory Visit: Payer: Self-pay

## 2020-04-19 ENCOUNTER — Emergency Department
Admission: EM | Admit: 2020-04-19 | Discharge: 2020-04-19 | Disposition: A | Discharge status: Home / Self Care | Payer: 59 | Attending: Emergency Medicine | Admitting: Emergency Medicine

## 2020-04-19 DIAGNOSIS — S81022A Laceration with foreign body, left knee, initial encounter: Secondary | ICD-10-CM | POA: Diagnosis not present

## 2020-04-19 DIAGNOSIS — Z23 Encounter for immunization: Secondary | ICD-10-CM | POA: Insufficient documentation

## 2020-04-19 DIAGNOSIS — R0902 Hypoxemia: Secondary | ICD-10-CM | POA: Diagnosis not present

## 2020-04-19 DIAGNOSIS — Z7982 Long term (current) use of aspirin: Secondary | ICD-10-CM | POA: Insufficient documentation

## 2020-04-19 DIAGNOSIS — W293XXA Contact with powered garden and outdoor hand tools and machinery, initial encounter: Secondary | ICD-10-CM | POA: Insufficient documentation

## 2020-04-19 DIAGNOSIS — Z85828 Personal history of other malignant neoplasm of skin: Secondary | ICD-10-CM | POA: Diagnosis not present

## 2020-04-19 DIAGNOSIS — S8992XA Unspecified injury of left lower leg, initial encounter: Secondary | ICD-10-CM | POA: Diagnosis present

## 2020-04-19 DIAGNOSIS — S81012A Laceration without foreign body, left knee, initial encounter: Secondary | ICD-10-CM | POA: Diagnosis not present

## 2020-04-19 DIAGNOSIS — R58 Hemorrhage, not elsewhere classified: Secondary | ICD-10-CM | POA: Diagnosis not present

## 2020-04-19 MED ORDER — NAPROXEN 500 MG PO TABS: 500.0000 mg | ORAL_TABLET | Freq: Two times a day (BID) | ORAL | Status: AC

## 2020-04-19 MED ORDER — SULFAMETHOXAZOLE-TRIMETHOPRIM 800-160 MG PO TABS: 1.0000 | ORAL_TABLET | Freq: Two times a day (BID) | ORAL | 0 refills | Status: AC

## 2020-04-19 MED ORDER — LIDOCAINE-EPINEPHRINE 2 %-1:100000 IJ SOLN
30.0000 mL | Freq: Once | INTRAMUSCULAR | Status: AC
  Administered 2020-04-19: 30 mL
  Filled 2020-04-19 (×2): qty 30
  Filled 2020-04-19: qty 2

## 2020-04-19 MED ORDER — TETANUS-DIPHTH-ACELL PERTUSSIS 5-2.5-18.5 LF-MCG/0.5 IM SUSY
0.5000 mL | PREFILLED_SYRINGE | Freq: Once | INTRAMUSCULAR | Status: AC
  Administered 2020-04-19: 0.5 mL via INTRAMUSCULAR
  Filled 2020-04-19: qty 0.5

## 2020-04-19 MED ORDER — BACITRACIN-NEOMYCIN-POLYMYXIN 400-5-5000 EX OINT
TOPICAL_OINTMENT | Freq: Once | CUTANEOUS | Status: AC
  Administered 2020-04-19: 1 via TOPICAL
  Filled 2020-04-19: qty 1

## 2020-04-19 NOTE — ED Triage Notes (Signed)
Lac to above the left knee from a chainsaw. Wrapped and bleeding controlled.

## 2020-04-19 NOTE — ED Provider Notes (Signed)
[facility] Emergency Department Provider Note   ____________________________________________   Event Date/Time   First MD Initiated Contact with Patient 04/19/20 1254     (approximate)  I have reviewed the triage vital signs and the nursing notes.   HISTORY  Chief Complaint Laceration    HPI Felicia Cantu is a 60 y.o. female patient arrived via EMS secondary to a chainsaw cut to the left knee.  Bleeding was controlled direct pressure.  Patient able to weight-bear.  Denies loss of sensation.  Tetanus shot not up-to-date.  Rates pain as a 5/10.  Described pain as "sore".         Past Medical History:  Diagnosis Date  . Breast tenderness   . 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 Depression   . Hyperlipemia   . Insomnia   . Positional vertigo   . Rectocele   . Renal disorder   . Stroke ([place])   . SUI (stress urinary incontinence, female)     Patient Active Problem List   Diagnosis Date Noted  . Status post vaginal hysterectomy 01/29/2015  . HLD (hyperlipidemia) 07/18/2013  . Clinical depression 07/18/2013  . Female genuine stress incontinence 05/23/2013  . Incomplete bladder emptying 05/23/2013  . Excessive urination at night 05/23/2013    Past Surgical History:  Procedure Laterality Date  . ANTERIOR AND POSTERIOR VAGINAL REPAIR    . BACK SURGERY    . CYSTECTOMY    . KNEE SURGERY    . PUBOVAGINAL SLING    . SHOULDER SURGERY    . VAGINAL HYSTERECTOMY     tvh lso rt salpingectomy   . VULVA /PERINEUM BIOPSY      Prior to Admission medications   Medication Sig Start Date End Date Taking? Authorizing Provider  naproxen (NAPROSYN) 500 MG tablet Take 1 tablet (500 mg total) by mouth 2 (two) times daily with a meal. 04/19/20  Yes Sable Feil, PA-C  sulfamethoxazole-trimethoprim (BACTRIM DS) 800-160 MG tablet Take 1 tablet by mouth 2 (two) times daily. 04/19/20  Yes Sable Feil, PA-C  aspirin 81 MG chewable  tablet Chew by mouth.    [provider]  lovastatin (MEVACOR) 40 MG tablet  11/04/14   [provider]  Melatonin 3 MG TABS Take by mouth.    [provider]  Multiple Vitamin (MULTI-VITAMINS) TABS Take by mouth.    [provider]  sertraline (ZOLOFT) 100 MG tablet 200 mg.  01/28/15   [provider]    Allergies Codeine, Penicillins, Vicodin [hydrocodone-acetaminophen], and Propoxyphene  Family History  Problem Relation Age of Onset  . Diabetes Mother   . Cancer Mother        skin cancer  . Heart disease Father   . Cancer Father        lung    Social History Social History   Tobacco Use  . Smoking status: Never Smoker  . Smokeless tobacco: Never Used  Substance Use Topics  . Alcohol use: No  . Drug use: No    Review of Systems Constitutional: No fever/chills Eyes: No visual changes. ENT: No sore throat. Cardiovascular: Denies chest pain. Respiratory: Denies shortness of breath. Gastrointestinal: No abdominal pain.  No nausea, no vomiting.  No diarrhea.  No constipation. Genitourinary: Negative for dysuria. Musculoskeletal: Negative for back pain. Skin: Negative for rash. Neurological: Negative for headaches, focal weakness or numbness. Psychiatric:  Insomnia Endocrine:  Hyperlipidemia. Hematological/Lymphatic:  Allergic/Immunilogical: Codeine, penicillin, and Percocets.  ____________________________________________   PHYSICAL EXAM:  VITAL SIGNS: ED Triage Vitals [04/19/20 1254]  Enc Vitals Group     BP (!) 147/99     Pulse Rate 82     Resp 18     Temp 98 F (36.7 C)     Temp Source Oral     SpO2 96 %     Weight      Height      Head Circumference      Peak Flow      Pain Score      Pain Loc      Pain Edu?      Excl. in [place]?    Constitutional: Alert and oriented. Well appearing and in no acute distress. Cardiovascular: Normal rate, regular rhythm. Grossly normal heart sounds.  Good peripheral  circulation. Respiratory: Normal respiratory effort.  No retractions. Lungs CTAB. Gastrointestinal: Soft and nontender. No distention. No abdominal bruits. No CVA tenderness. Genitourinary: Deferred Musculoskeletal: No lower extremity tenderness nor edema.  No joint effusions. Neurologic:  Normal speech and language. No gross focal neurologic deficits are appreciated. No gait instability. Skin:  Skin is warm, dry and intact. No rash noted. Psychiatric: Mood and affect are normal. Speech and behavior are normal.  ____________________________________________   LABS (all labs ordered are listed, but only abnormal results are displayed)  Labs Reviewed - No data to display ____________________________________________  EKG   ____________________________________________  RADIOLOGY I, Sable Feil, personally viewed and evaluated these images (plain radiographs) as part of my medical decision making, as well as reviewing the written report by the radiologist.  ED MD interpretation:    Official radiology report(s): No results found.  ____________________________________________   PROCEDURES  Procedure(s) performed (including Critical Care):  Marland KitchenMarland KitchenLaceration Repair  Date/Time: 04/19/2020 2:15 PM Performed by: Sable Feil, PA-C Authorized by: Sable Feil, PA-C   Consent:    Consent obtained:  Verbal   Consent given by:  Patient   Risks, benefits, and alternatives were discussed: yes     Risks discussed:  Infection, pain, poor cosmetic result, need for additional repair and poor wound healing Universal protocol:    Procedure explained and questions answered to patient or proxy's satisfaction: yes     Relevant documents present and verified: yes     Immediately prior to procedure, a time out was called: yes     Patient identity confirmed:  Verbally with patient Anesthesia:    Anesthesia method:  Local infiltration   Local anesthetic:  Lidocaine 2% WITH epi Laceration  details:    Location:  Leg   Leg location:  L knee   Length (cm):  8   Depth (mm):  2 Pre-procedure details:    Preparation:  Patient was prepped and draped in usual sterile fashion Exploration:    Hemostasis achieved with:  Direct pressure   Contaminated: yes   Treatment:    Area cleansed with:  Povidone-iodine and saline   Amount of cleaning:  Extensive   Irrigation solution:  Sterile saline   Irrigation method:  Syringe   Visualized foreign bodies/material removed: yes     Debridement:  None Skin repair:    Repair method:  Sutures   Suture size:  3-0   Suture material:  Nylon   Suture technique:  Simple interrupted   Number of sutures:  15 Approximation:    Approximation:  Close Repair type:    Repair type:  Simple Post-procedure details:    Dressing:  Antibiotic ointment and  sterile dressing   Procedure completion:  Tolerated well, no immediate complications     ____________________________________________   INITIAL IMPRESSION / ASSESSMENT AND PLAN / ED COURSE  As part of my medical decision making, I reviewed the following data within the [facility]         Patient presents with left knee laceration secondary to a chainsaw cut.  See procedure note for wound closure.  Patient given discharge care instruction advised take medication as directed.  Patient advised to have sutures removed in 10 days.  Return back if condition worsen before suture removal date.      ____________________________________________   FINAL CLINICAL IMPRESSION(S) / ED DIAGNOSES  Final diagnoses:  Knee laceration, left, initial encounter     ED Discharge Orders         Ordered    sulfamethoxazole-trimethoprim (BACTRIM DS) 800-160 MG tablet  2 times daily        04/19/20 1413    naproxen (NAPROSYN) 500 MG tablet  2 times daily with meals        04/19/20 1413          *Please note:  Felicia Cantu was evaluated in Emergency Department on 04/19/2020 for  the symptoms described in the history of present illness. She was evaluated in the context of the global COVID-19 pandemic, which necessitated consideration that the patient might be at risk for infection with the SARS-CoV-2 virus that causes COVID-19. Institutional protocols and algorithms that pertain to the evaluation of patients at risk for COVID-19 are in a state of rapid change based on information released by regulatory bodies including the CDC and federal and state organizations. These policies and algorithms were followed during the patient's care in the ED.  Some ED evaluations and interventions may be delayed as a result of limited staffing during and the pandemic.*   Note:  This document was prepared using Dragon voice recognition software and may include unintentional dictation errors.    Sable Feil, PA-C 04/19/20 [place], MD 04/20/20 [phone]

## 2020-04-19 NOTE — Discharge Instructions (Signed)
Follow discharge care instructions.  T [place] to the mechanism injury and a debriefing required you will be placed on antibiotics for 10 days.

## 2020-04-22 DIAGNOSIS — S81012A Laceration without foreign body, left knee, initial encounter: Secondary | ICD-10-CM | POA: Diagnosis not present

## 2020-04-29 DIAGNOSIS — S81012D Laceration without foreign body, left knee, subsequent encounter: Secondary | ICD-10-CM | POA: Diagnosis not present

## 2020-05-01 DIAGNOSIS — S81012D Laceration without foreign body, left knee, subsequent encounter: Secondary | ICD-10-CM | POA: Diagnosis not present

## 2020-05-06 DIAGNOSIS — S81012D Laceration without foreign body, left knee, subsequent encounter: Secondary | ICD-10-CM | POA: Diagnosis not present

## 2020-05-08 ENCOUNTER — Other Ambulatory Visit: Payer: Self-pay | Admitting: Orthopedic Surgery

## 2020-05-08 DIAGNOSIS — S76122A Laceration of left quadriceps muscle, fascia and tendon, initial encounter: Secondary | ICD-10-CM

## 2020-05-08 DIAGNOSIS — M25562 Pain in left knee: Secondary | ICD-10-CM

## 2020-05-08 DIAGNOSIS — M222X2 Patellofemoral disorders, left knee: Secondary | ICD-10-CM

## 2020-05-18 ENCOUNTER — Ambulatory Visit: Payer: 59

## 2020-05-26 ENCOUNTER — Other Ambulatory Visit: Payer: Self-pay

## 2020-05-26 ENCOUNTER — Ambulatory Visit
Admission: RE | Admit: 2020-05-26 | Discharge: 2020-05-26 | Disposition: A | Discharge status: Home / Self Care | Payer: 59 | Source: Ambulatory Visit | Attending: Orthopedic Surgery | Admitting: Orthopedic Surgery

## 2020-05-26 DIAGNOSIS — M222X2 Patellofemoral disorders, left knee: Secondary | ICD-10-CM | POA: Diagnosis not present

## 2020-05-26 DIAGNOSIS — S76122A Laceration of left quadriceps muscle, fascia and tendon, initial encounter: Secondary | ICD-10-CM | POA: Diagnosis not present

## 2020-05-26 DIAGNOSIS — M25562 Pain in left knee: Secondary | ICD-10-CM | POA: Insufficient documentation

## 2020-06-10 DIAGNOSIS — S76122A Laceration of left quadriceps muscle, fascia and tendon, initial encounter: Secondary | ICD-10-CM | POA: Diagnosis not present

## 2020-06-10 DIAGNOSIS — M6281 Muscle weakness (generalized): Secondary | ICD-10-CM | POA: Diagnosis not present

## 2020-06-10 DIAGNOSIS — M25562 Pain in left knee: Secondary | ICD-10-CM | POA: Diagnosis not present

## 2020-06-10 DIAGNOSIS — M222X2 Patellofemoral disorders, left knee: Secondary | ICD-10-CM | POA: Diagnosis not present

## 2020-06-12 DIAGNOSIS — M222X2 Patellofemoral disorders, left knee: Secondary | ICD-10-CM | POA: Diagnosis not present

## 2020-06-12 DIAGNOSIS — S76122D Laceration of left quadriceps muscle, fascia and tendon, subsequent encounter: Secondary | ICD-10-CM | POA: Diagnosis not present

## 2020-06-12 DIAGNOSIS — M25562 Pain in left knee: Secondary | ICD-10-CM | POA: Diagnosis not present

## 2020-06-12 DIAGNOSIS — S76122A Laceration of left quadriceps muscle, fascia and tendon, initial encounter: Secondary | ICD-10-CM | POA: Diagnosis not present

## 2020-06-12 DIAGNOSIS — M6281 Muscle weakness (generalized): Secondary | ICD-10-CM | POA: Diagnosis not present

## 2020-06-19 DIAGNOSIS — M6281 Muscle weakness (generalized): Secondary | ICD-10-CM | POA: Diagnosis not present

## 2020-06-19 DIAGNOSIS — M25562 Pain in left knee: Secondary | ICD-10-CM | POA: Diagnosis not present

## 2020-06-19 DIAGNOSIS — S76122A Laceration of left quadriceps muscle, fascia and tendon, initial encounter: Secondary | ICD-10-CM | POA: Diagnosis not present

## 2020-06-19 DIAGNOSIS — M222X2 Patellofemoral disorders, left knee: Secondary | ICD-10-CM | POA: Diagnosis not present

## 2020-06-23 DIAGNOSIS — M25562 Pain in left knee: Secondary | ICD-10-CM | POA: Diagnosis not present

## 2020-06-23 DIAGNOSIS — M6281 Muscle weakness (generalized): Secondary | ICD-10-CM | POA: Diagnosis not present

## 2020-06-23 DIAGNOSIS — S76122A Laceration of left quadriceps muscle, fascia and tendon, initial encounter: Secondary | ICD-10-CM | POA: Diagnosis not present

## 2020-06-23 DIAGNOSIS — M222X2 Patellofemoral disorders, left knee: Secondary | ICD-10-CM | POA: Diagnosis not present

## 2020-06-26 DIAGNOSIS — S76122A Laceration of left quadriceps muscle, fascia and tendon, initial encounter: Secondary | ICD-10-CM | POA: Diagnosis not present

## 2020-06-26 DIAGNOSIS — M25562 Pain in left knee: Secondary | ICD-10-CM | POA: Diagnosis not present

## 2020-06-26 DIAGNOSIS — M6281 Muscle weakness (generalized): Secondary | ICD-10-CM | POA: Diagnosis not present

## 2020-06-26 DIAGNOSIS — M222X2 Patellofemoral disorders, left knee: Secondary | ICD-10-CM | POA: Diagnosis not present

## 2020-06-30 DIAGNOSIS — S76122A Laceration of left quadriceps muscle, fascia and tendon, initial encounter: Secondary | ICD-10-CM | POA: Diagnosis not present

## 2020-06-30 DIAGNOSIS — M6281 Muscle weakness (generalized): Secondary | ICD-10-CM | POA: Diagnosis not present

## 2020-06-30 DIAGNOSIS — M25562 Pain in left knee: Secondary | ICD-10-CM | POA: Diagnosis not present

## 2020-06-30 DIAGNOSIS — M222X2 Patellofemoral disorders, left knee: Secondary | ICD-10-CM | POA: Diagnosis not present

## 2020-07-01 DIAGNOSIS — M222X2 Patellofemoral disorders, left knee: Secondary | ICD-10-CM | POA: Diagnosis not present

## 2020-07-01 DIAGNOSIS — S76122D Laceration of left quadriceps muscle, fascia and tendon, subsequent encounter: Secondary | ICD-10-CM | POA: Diagnosis not present

## 2020-07-02 DIAGNOSIS — M6281 Muscle weakness (generalized): Secondary | ICD-10-CM | POA: Diagnosis not present

## 2020-07-02 DIAGNOSIS — S76122A Laceration of left quadriceps muscle, fascia and tendon, initial encounter: Secondary | ICD-10-CM | POA: Diagnosis not present

## 2020-07-02 DIAGNOSIS — M222X2 Patellofemoral disorders, left knee: Secondary | ICD-10-CM | POA: Diagnosis not present

## 2020-07-02 DIAGNOSIS — M25562 Pain in left knee: Secondary | ICD-10-CM | POA: Diagnosis not present

## 2020-07-14 DIAGNOSIS — M25562 Pain in left knee: Secondary | ICD-10-CM | POA: Diagnosis not present

## 2020-07-14 DIAGNOSIS — M6281 Muscle weakness (generalized): Secondary | ICD-10-CM | POA: Diagnosis not present

## 2020-07-14 DIAGNOSIS — S76122A Laceration of left quadriceps muscle, fascia and tendon, initial encounter: Secondary | ICD-10-CM | POA: Diagnosis not present

## 2020-07-14 DIAGNOSIS — M222X2 Patellofemoral disorders, left knee: Secondary | ICD-10-CM | POA: Diagnosis not present

## 2020-07-17 DIAGNOSIS — M6281 Muscle weakness (generalized): Secondary | ICD-10-CM | POA: Diagnosis not present

## 2020-07-17 DIAGNOSIS — M25562 Pain in left knee: Secondary | ICD-10-CM | POA: Diagnosis not present

## 2020-07-17 DIAGNOSIS — M222X2 Patellofemoral disorders, left knee: Secondary | ICD-10-CM | POA: Diagnosis not present

## 2020-07-17 DIAGNOSIS — S76122A Laceration of left quadriceps muscle, fascia and tendon, initial encounter: Secondary | ICD-10-CM | POA: Diagnosis not present

## 2020-07-21 DIAGNOSIS — M222X2 Patellofemoral disorders, left knee: Secondary | ICD-10-CM | POA: Diagnosis not present

## 2020-07-21 DIAGNOSIS — M6281 Muscle weakness (generalized): Secondary | ICD-10-CM | POA: Diagnosis not present

## 2020-07-21 DIAGNOSIS — M25562 Pain in left knee: Secondary | ICD-10-CM | POA: Diagnosis not present

## 2020-07-21 DIAGNOSIS — S76122A Laceration of left quadriceps muscle, fascia and tendon, initial encounter: Secondary | ICD-10-CM | POA: Diagnosis not present

## 2020-07-24 DIAGNOSIS — M25562 Pain in left knee: Secondary | ICD-10-CM | POA: Diagnosis not present

## 2020-07-24 DIAGNOSIS — M222X2 Patellofemoral disorders, left knee: Secondary | ICD-10-CM | POA: Diagnosis not present

## 2020-07-24 DIAGNOSIS — S76122A Laceration of left quadriceps muscle, fascia and tendon, initial encounter: Secondary | ICD-10-CM | POA: Diagnosis not present

## 2020-07-24 DIAGNOSIS — M6281 Muscle weakness (generalized): Secondary | ICD-10-CM | POA: Diagnosis not present

## 2020-07-29 DIAGNOSIS — S76122D Laceration of left quadriceps muscle, fascia and tendon, subsequent encounter: Secondary | ICD-10-CM | POA: Diagnosis not present

## 2020-07-29 DIAGNOSIS — M222X2 Patellofemoral disorders, left knee: Secondary | ICD-10-CM | POA: Diagnosis not present

## 2020-07-29 DIAGNOSIS — E669 Obesity, unspecified: Secondary | ICD-10-CM | POA: Diagnosis not present

## 2020-08-01 DIAGNOSIS — M25562 Pain in left knee: Secondary | ICD-10-CM | POA: Diagnosis not present

## 2020-08-01 DIAGNOSIS — M6281 Muscle weakness (generalized): Secondary | ICD-10-CM | POA: Diagnosis not present

## 2020-08-01 DIAGNOSIS — M222X2 Patellofemoral disorders, left knee: Secondary | ICD-10-CM | POA: Diagnosis not present

## 2020-08-01 DIAGNOSIS — S76122A Laceration of left quadriceps muscle, fascia and tendon, initial encounter: Secondary | ICD-10-CM | POA: Diagnosis not present

## 2020-08-04 DIAGNOSIS — S76122A Laceration of left quadriceps muscle, fascia and tendon, initial encounter: Secondary | ICD-10-CM | POA: Diagnosis not present

## 2020-08-04 DIAGNOSIS — M222X2 Patellofemoral disorders, left knee: Secondary | ICD-10-CM | POA: Diagnosis not present

## 2020-08-04 DIAGNOSIS — M6281 Muscle weakness (generalized): Secondary | ICD-10-CM | POA: Diagnosis not present

## 2020-08-04 DIAGNOSIS — M25562 Pain in left knee: Secondary | ICD-10-CM | POA: Diagnosis not present

## 2020-08-07 DIAGNOSIS — S76122A Laceration of left quadriceps muscle, fascia and tendon, initial encounter: Secondary | ICD-10-CM | POA: Diagnosis not present

## 2020-08-07 DIAGNOSIS — M222X2 Patellofemoral disorders, left knee: Secondary | ICD-10-CM | POA: Diagnosis not present

## 2020-08-07 DIAGNOSIS — Z0000 Encounter for general adult medical examination without abnormal findings: Secondary | ICD-10-CM | POA: Diagnosis not present

## 2020-08-07 DIAGNOSIS — M25562 Pain in left knee: Secondary | ICD-10-CM | POA: Diagnosis not present

## 2020-08-07 DIAGNOSIS — R7989 Other specified abnormal findings of blood chemistry: Secondary | ICD-10-CM | POA: Diagnosis not present

## 2020-08-07 DIAGNOSIS — M6281 Muscle weakness (generalized): Secondary | ICD-10-CM | POA: Diagnosis not present

## 2020-08-08 DIAGNOSIS — E039 Hypothyroidism, unspecified: Secondary | ICD-10-CM | POA: Diagnosis not present

## 2020-08-08 DIAGNOSIS — R82998 Other abnormal findings in urine: Secondary | ICD-10-CM | POA: Diagnosis not present

## 2020-08-12 DIAGNOSIS — M222X2 Patellofemoral disorders, left knee: Secondary | ICD-10-CM | POA: Diagnosis not present

## 2020-08-12 DIAGNOSIS — M25562 Pain in left knee: Secondary | ICD-10-CM | POA: Diagnosis not present

## 2020-08-12 DIAGNOSIS — S76122A Laceration of left quadriceps muscle, fascia and tendon, initial encounter: Secondary | ICD-10-CM | POA: Diagnosis not present

## 2020-08-12 DIAGNOSIS — M6281 Muscle weakness (generalized): Secondary | ICD-10-CM | POA: Diagnosis not present

## 2020-08-14 ENCOUNTER — Other Ambulatory Visit: Payer: Self-pay | Admitting: Family Medicine

## 2020-08-14 DIAGNOSIS — S76122A Laceration of left quadriceps muscle, fascia and tendon, initial encounter: Secondary | ICD-10-CM | POA: Diagnosis not present

## 2020-08-14 DIAGNOSIS — R69 Illness, unspecified: Secondary | ICD-10-CM | POA: Diagnosis not present

## 2020-08-14 DIAGNOSIS — E7800 Pure hypercholesterolemia, unspecified: Secondary | ICD-10-CM | POA: Diagnosis not present

## 2020-08-14 DIAGNOSIS — M222X2 Patellofemoral disorders, left knee: Secondary | ICD-10-CM | POA: Diagnosis not present

## 2020-08-14 DIAGNOSIS — R946 Abnormal results of thyroid function studies: Secondary | ICD-10-CM | POA: Diagnosis not present

## 2020-08-14 DIAGNOSIS — Z1231 Encounter for screening mammogram for malignant neoplasm of breast: Secondary | ICD-10-CM

## 2020-08-14 DIAGNOSIS — R7989 Other specified abnormal findings of blood chemistry: Secondary | ICD-10-CM | POA: Diagnosis not present

## 2020-08-14 DIAGNOSIS — R7303 Prediabetes: Secondary | ICD-10-CM | POA: Diagnosis not present

## 2020-08-14 DIAGNOSIS — M25562 Pain in left knee: Secondary | ICD-10-CM | POA: Diagnosis not present

## 2020-08-14 DIAGNOSIS — Z0000 Encounter for general adult medical examination without abnormal findings: Secondary | ICD-10-CM | POA: Diagnosis not present

## 2020-08-14 DIAGNOSIS — M6281 Muscle weakness (generalized): Secondary | ICD-10-CM | POA: Diagnosis not present

## 2020-08-22 ENCOUNTER — Other Ambulatory Visit: Payer: Self-pay

## 2020-08-22 ENCOUNTER — Ambulatory Visit
Admission: RE | Admit: 2020-08-22 | Discharge: 2020-08-22 | Disposition: A | Discharge status: Home / Self Care | Payer: 59 | Source: Ambulatory Visit | Attending: Family Medicine | Admitting: Family Medicine

## 2020-08-22 DIAGNOSIS — Z1231 Encounter for screening mammogram for malignant neoplasm of breast: Secondary | ICD-10-CM | POA: Diagnosis present

## 2020-08-22 DIAGNOSIS — M222X2 Patellofemoral disorders, left knee: Secondary | ICD-10-CM | POA: Diagnosis not present

## 2020-08-22 DIAGNOSIS — S76122D Laceration of left quadriceps muscle, fascia and tendon, subsequent encounter: Secondary | ICD-10-CM | POA: Diagnosis not present

## 2020-08-22 DIAGNOSIS — E669 Obesity, unspecified: Secondary | ICD-10-CM | POA: Diagnosis not present

## 2020-08-25 DIAGNOSIS — M25562 Pain in left knee: Secondary | ICD-10-CM | POA: Diagnosis not present

## 2020-08-25 DIAGNOSIS — M222X2 Patellofemoral disorders, left knee: Secondary | ICD-10-CM | POA: Diagnosis not present

## 2020-08-25 DIAGNOSIS — S76122A Laceration of left quadriceps muscle, fascia and tendon, initial encounter: Secondary | ICD-10-CM | POA: Diagnosis not present

## 2020-08-25 DIAGNOSIS — M6281 Muscle weakness (generalized): Secondary | ICD-10-CM | POA: Diagnosis not present

## 2020-08-26 ENCOUNTER — Other Ambulatory Visit: Payer: Self-pay | Admitting: *Deleted

## 2020-08-26 ENCOUNTER — Inpatient Hospital Stay
Admission: RE | Admit: 2020-08-26 | Discharge: 2020-08-26 | Disposition: A | Discharge status: Home / Self Care | Payer: Self-pay | Source: Ambulatory Visit | Attending: *Deleted | Admitting: *Deleted

## 2020-08-26 DIAGNOSIS — Z1231 Encounter for screening mammogram for malignant neoplasm of breast: Secondary | ICD-10-CM

## 2020-09-01 DIAGNOSIS — M25562 Pain in left knee: Secondary | ICD-10-CM | POA: Diagnosis not present

## 2020-09-01 DIAGNOSIS — S76122A Laceration of left quadriceps muscle, fascia and tendon, initial encounter: Secondary | ICD-10-CM | POA: Diagnosis not present

## 2020-09-01 DIAGNOSIS — M222X2 Patellofemoral disorders, left knee: Secondary | ICD-10-CM | POA: Diagnosis not present

## 2020-09-01 DIAGNOSIS — M6281 Muscle weakness (generalized): Secondary | ICD-10-CM | POA: Diagnosis not present

## 2020-09-04 DIAGNOSIS — M222X2 Patellofemoral disorders, left knee: Secondary | ICD-10-CM | POA: Diagnosis not present

## 2020-09-04 DIAGNOSIS — S76122A Laceration of left quadriceps muscle, fascia and tendon, initial encounter: Secondary | ICD-10-CM | POA: Diagnosis not present

## 2020-09-04 DIAGNOSIS — M25562 Pain in left knee: Secondary | ICD-10-CM | POA: Diagnosis not present

## 2020-09-04 DIAGNOSIS — M6281 Muscle weakness (generalized): Secondary | ICD-10-CM | POA: Diagnosis not present

## 2020-09-08 DIAGNOSIS — S76122A Laceration of left quadriceps muscle, fascia and tendon, initial encounter: Secondary | ICD-10-CM | POA: Diagnosis not present

## 2020-09-08 DIAGNOSIS — M6281 Muscle weakness (generalized): Secondary | ICD-10-CM | POA: Diagnosis not present

## 2020-09-08 DIAGNOSIS — M222X2 Patellofemoral disorders, left knee: Secondary | ICD-10-CM | POA: Diagnosis not present

## 2020-09-08 DIAGNOSIS — M25562 Pain in left knee: Secondary | ICD-10-CM | POA: Diagnosis not present

## 2020-09-11 DIAGNOSIS — M25562 Pain in left knee: Secondary | ICD-10-CM | POA: Diagnosis not present

## 2020-09-11 DIAGNOSIS — S76122A Laceration of left quadriceps muscle, fascia and tendon, initial encounter: Secondary | ICD-10-CM | POA: Diagnosis not present

## 2020-09-11 DIAGNOSIS — R946 Abnormal results of thyroid function studies: Secondary | ICD-10-CM | POA: Diagnosis not present

## 2020-09-11 DIAGNOSIS — M222X2 Patellofemoral disorders, left knee: Secondary | ICD-10-CM | POA: Diagnosis not present

## 2020-09-11 DIAGNOSIS — M6281 Muscle weakness (generalized): Secondary | ICD-10-CM | POA: Diagnosis not present

## 2020-09-26 DIAGNOSIS — S76122D Laceration of left quadriceps muscle, fascia and tendon, subsequent encounter: Secondary | ICD-10-CM | POA: Diagnosis not present

## 2020-09-26 DIAGNOSIS — M222X2 Patellofemoral disorders, left knee: Secondary | ICD-10-CM | POA: Diagnosis not present

## 2020-09-26 DIAGNOSIS — E669 Obesity, unspecified: Secondary | ICD-10-CM | POA: Diagnosis not present

## 2020-10-24 DIAGNOSIS — S76122D Laceration of left quadriceps muscle, fascia and tendon, subsequent encounter: Secondary | ICD-10-CM | POA: Diagnosis not present

## 2020-10-24 DIAGNOSIS — E669 Obesity, unspecified: Secondary | ICD-10-CM | POA: Diagnosis not present

## 2020-10-24 DIAGNOSIS — M222X2 Patellofemoral disorders, left knee: Secondary | ICD-10-CM | POA: Diagnosis not present

## 2020-10-24 DIAGNOSIS — R946 Abnormal results of thyroid function studies: Secondary | ICD-10-CM | POA: Diagnosis not present

## 2020-12-03 DIAGNOSIS — S76122D Laceration of left quadriceps muscle, fascia and tendon, subsequent encounter: Secondary | ICD-10-CM | POA: Diagnosis not present

## 2020-12-03 DIAGNOSIS — M222X2 Patellofemoral disorders, left knee: Secondary | ICD-10-CM | POA: Diagnosis not present

## 2020-12-03 DIAGNOSIS — E669 Obesity, unspecified: Secondary | ICD-10-CM | POA: Diagnosis not present

## 2021-03-12 DIAGNOSIS — R7303 Prediabetes: Secondary | ICD-10-CM | POA: Diagnosis not present

## 2021-03-12 DIAGNOSIS — E7800 Pure hypercholesterolemia, unspecified: Secondary | ICD-10-CM | POA: Diagnosis not present

## 2021-03-12 DIAGNOSIS — R69 Illness, unspecified: Secondary | ICD-10-CM | POA: Diagnosis not present

## 2021-03-12 DIAGNOSIS — R42 Dizziness and giddiness: Secondary | ICD-10-CM | POA: Diagnosis not present

## 2021-03-12 DIAGNOSIS — E039 Hypothyroidism, unspecified: Secondary | ICD-10-CM | POA: Diagnosis not present

## 2021-04-09 DIAGNOSIS — R42 Dizziness and giddiness: Secondary | ICD-10-CM | POA: Diagnosis not present

## 2021-04-09 DIAGNOSIS — R7303 Prediabetes: Secondary | ICD-10-CM | POA: Diagnosis not present

## 2021-04-09 DIAGNOSIS — E785 Hyperlipidemia, unspecified: Secondary | ICD-10-CM | POA: Diagnosis not present

## 2021-04-30 DIAGNOSIS — R69 Illness, unspecified: Secondary | ICD-10-CM | POA: Diagnosis not present

## 2021-04-30 DIAGNOSIS — E119 Type 2 diabetes mellitus without complications: Secondary | ICD-10-CM | POA: Diagnosis not present

## 2021-04-30 DIAGNOSIS — E7800 Pure hypercholesterolemia, unspecified: Secondary | ICD-10-CM | POA: Diagnosis not present

## 2021-07-03 DIAGNOSIS — W57XXXA Bitten or stung by nonvenomous insect and other nonvenomous arthropods, initial encounter: Secondary | ICD-10-CM | POA: Diagnosis not present

## 2021-07-03 DIAGNOSIS — L255 Unspecified contact dermatitis due to plants, except food: Secondary | ICD-10-CM | POA: Diagnosis not present

## 2021-07-03 DIAGNOSIS — S40862A Insect bite (nonvenomous) of left upper arm, initial encounter: Secondary | ICD-10-CM | POA: Diagnosis not present

## 2021-09-17 DIAGNOSIS — R69 Illness, unspecified: Secondary | ICD-10-CM | POA: Diagnosis not present

## 2021-09-17 DIAGNOSIS — S61451A Open bite of right hand, initial encounter: Secondary | ICD-10-CM | POA: Diagnosis not present

## 2021-09-17 DIAGNOSIS — W540XXA Bitten by dog, initial encounter: Secondary | ICD-10-CM | POA: Diagnosis not present

## 2021-09-17 DIAGNOSIS — M189 Osteoarthritis of first carpometacarpal joint, unspecified: Secondary | ICD-10-CM | POA: Diagnosis not present

## 2021-09-17 DIAGNOSIS — M7989 Other specified soft tissue disorders: Secondary | ICD-10-CM | POA: Diagnosis not present

## 2021-09-23 DIAGNOSIS — S61256A Open bite of right little finger without damage to nail, initial encounter: Secondary | ICD-10-CM | POA: Diagnosis not present

## 2021-09-23 DIAGNOSIS — Z6830 Body mass index (BMI) 30.0-30.9, adult: Secondary | ICD-10-CM | POA: Diagnosis not present

## 2021-09-23 DIAGNOSIS — E119 Type 2 diabetes mellitus without complications: Secondary | ICD-10-CM | POA: Diagnosis not present

## 2021-09-23 DIAGNOSIS — W540XXA Bitten by dog, initial encounter: Secondary | ICD-10-CM | POA: Diagnosis not present

## 2021-10-07 DIAGNOSIS — E119 Type 2 diabetes mellitus without complications: Secondary | ICD-10-CM | POA: Diagnosis not present

## 2021-10-07 DIAGNOSIS — W540XXA Bitten by dog, initial encounter: Secondary | ICD-10-CM | POA: Diagnosis not present

## 2021-10-07 DIAGNOSIS — M65331 Trigger finger, right middle finger: Secondary | ICD-10-CM | POA: Diagnosis not present

## 2021-10-07 DIAGNOSIS — S61256D Open bite of right little finger without damage to nail, subsequent encounter: Secondary | ICD-10-CM | POA: Diagnosis not present

## 2021-10-07 DIAGNOSIS — Z6830 Body mass index (BMI) 30.0-30.9, adult: Secondary | ICD-10-CM | POA: Diagnosis not present

## 2021-10-14 ENCOUNTER — Ambulatory Visit: Payer: 59 | Attending: Orthopedic Surgery | Admitting: Occupational Therapy

## 2021-10-14 ENCOUNTER — Encounter: Payer: Self-pay | Admitting: Occupational Therapy

## 2021-10-14 DIAGNOSIS — M79641 Pain in right hand: Secondary | ICD-10-CM | POA: Diagnosis not present

## 2021-10-14 DIAGNOSIS — L905 Scar conditions and fibrosis of skin: Secondary | ICD-10-CM | POA: Diagnosis not present

## 2021-10-14 DIAGNOSIS — M25641 Stiffness of right hand, not elsewhere classified: Secondary | ICD-10-CM | POA: Diagnosis not present

## 2021-10-14 DIAGNOSIS — R600 Localized edema: Secondary | ICD-10-CM | POA: Insufficient documentation

## 2021-10-14 NOTE — Therapy (Signed)
[facility] [address], [place], [address] Phone: [phone]   Fax:  [phone]  Occupational Therapy Evaluation  Patient Details  Name: Felicia Cantu MRN: [id number] Date of Birth: 06-08-1960 Referring Provider (OT): [place]   Encounter Date: 10/14/2021   OT End of Session - 10/14/21 1943     Visit Number 1    Number of Visits 16    Date for OT Re-Evaluation 12/09/21    OT Start Time 7026    OT Stop Time 1703    OT Time Calculation (min) 46 min    Activity Tolerance Patient tolerated treatment well    Behavior During Therapy [facility] for tasks assessed/performed             Past Medical History:  Diagnosis Date   Breast tenderness    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   Subjective Assessment - 10/14/21 1924     Subjective  I cannot touch or tolerate anyting on the scar in my palm - bite was thru my hand and into middle and finger fingers. Pain when trying to use my fingers and thumb or move them - up into my forearm. I am R hand dominant.    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 Yes    Pain Score 5    increase to 10/10 with motion or use   Pain Location Hand    Pain Orientation Right    Pain Descriptors / Indicators Aching;Tender;Tightness;Radiating    Pain Type Acute pain    Pain Onset 1 to 4 weeks ago    Pain Frequency Intermittent               [facility] OT Assessment - 10/14/21 0001       Assessment   Medical Diagnosis Dog bite R hand multiple wounds, trigger fingers 2nd thru 5th    Referring Provider (OT) Prescott Parma PA    Onset Date/Surgical Date 09/17/21    Hand Dominance Right    Next MD Visit 3 wks      Home  Environment   Lives With Spouse      Prior Function   Vocation  Full time employment    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      AROM   Right Wrist Extension 58 Degrees   Pain   Right Wrist Flexion 85 Degrees   Slight pull     Right Hand AROM   R Thumb Radial ABduction/ADduction 0-55 40    R Thumb Palmar ABduction/ADduction 0-45 50    R Thumb Opposition to Index --   Opposition to 3rd - pain to 4th and 5the - sliding down   R Index  MCP 0-90 80 Degrees    R Index PIP 0-100 90 Degrees   -5   R Long  MCP 0-90 90 Degrees    R Long PIP 0-100 90 Degrees   -10   R Ring  MCP 0-90 90 Degrees    R Ring PIP 0-100 90 Degrees   -10   R Little  MCP 0-90 90 Degrees    R Little PIP 0-100 90 Degrees   -15                Patient to contrast 2-3 times a day to right hand and wrist.  Followed by desensitization and scar massage.  Patient to do several times during the day 10 seconds at a time to 2030 seconds.  Scar massage over Cica -Care scar pad tolerance better  As well as soft textures and using hand with bathing and dressing and drying with towel. Cica -Care scar pad at nighttime under Isotoner glove fitted.  With Cica -Care  scar pad on patient to do rolling over read roller for digit extension and soft tissue massage to volar hand.  Prior to Tendon glides with lumbrical fist followed by blocked intrinsic fist in composite fist to red roller if decreased pain can do to highlighter out of palm and then palm.  Over several days. Patient to hold off on any forceful motion or strengthening patient to focus on scar management in desensitization followed by edema and active range of motion keeping pain under 2/10.             OT Education - 10/14/21 1941     Education Details Findings of evaluation and home program    Person(s) Educated Patient    Methods Explanation;Demonstration;Tactile cues;Verbal cues;Handout    Comprehension Verbalized understanding;Verbal cues required;Returned demonstration              OT Short Term Goals - 10/14/21 1954       OT SHORT TERM GOAL #1   Title Patient to be independent in home program to decrease hypersensitivity to palmar scar to tolerate using hand in bathing and dressing with pain less than 3/10    Baseline Unable to tolerate soft massage and soft textures more than 20 seconds.  Pain increases to a 5-10/10 with scar massage unable to use towel.  Or use any tools in hand.    Time 3    Period Weeks    Status New    Target Date 11/04/21               OT Long Term Goals - 10/14/21 1955       OT LONG TERM GOAL #1   Title Patient hypersensitivity in the right hand improved for patient to be able to use hand normally to grip tools, utensils with pain less than 2/10    Baseline Pain and hypersensitivity over palmar scar increased from 5-10/10.  Tolerate soft massage  as well as soft textures less than 20 seconds.  Unable to grip any and hand touch and scar.    Time 5    Period Weeks    Status New    Target Date 11/18/21      OT LONG TERM GOAL #2   Title Active range of motion in right hand digits improved to within normal limits for composite flexion as  well as full extension with no triggering and able to don and doff gloves and put hand in pocket.    Baseline Patient still triggering of second through fourth with composite fist.  Pain with any active range of motion extension and flexion 5-10/10, MC flexion 82nd digit 93rd through fifth, PIP flexion 90 degrees DIP flexion 30 through 45 degrees.    Time 6    Period Weeks    Status New    Target Date 11/25/21      OT LONG TERM GOAL #3   Title Right wrist active range of motion improved to within normal limits in all planes symptom-free.    Baseline Wrist extension 58 with pain.  Flexion 85 slight pull over dorsal wrist    Time 4    Period Weeks    Status New    Target Date 11/11/21      OT LONG TERM GOAL #4   Title Right hand grip and prehension strength improved to more than 75% compared to left hand to be able to return to prior level of using hand and yard work, crafts and return back to work.    Baseline Unable to use hand in any gripping or prehension pain increases to 10/10, hypersensitivity with scar stiffness in all digits and triggering.  Strength not tested.    Time 8    Period Weeks    Status New    Target Date 12/09/21                   Plan - 10/14/21 1945     Clinical Impression Statement Patient present at OT evaluation 4 weeks out from dog bite to right hand with multiple puncture wounds.  With large puncture wound and palm.  Patient with hypersensitivity on palmar scar unable to tolerate any massage or textures.  As well as gripping objects.  Patient with increased pain in wrist extension flexion as well as digit flexion extension including thumb.  Pain at rest 5/10 can increase to 10/10 with range of motion and attempts of functional use of right dominant hand.  Patient present with increased scar tissue with increased edema as well as decreased range of motion and strength.  Patient with trigger fingers to second through fourth digits with tenderness over A1  pulley.  Patient can benefit from skilled OT services to decrease scar tissue, desensitization, edema and pain with increased motion and strength to return to prior level of function.    OT Occupational Profile and History Problem Focused Assessment - Including review of records relating to presenting problem    Occupational performance deficits (Please refer to evaluation for details): ADL's;IADL's;Work;Play;Leisure;Social Participation    Body Structure / Function / Physical Skills ADL;Strength;Pain;Dexterity;Edema;UE functional use;IADL;ROM;Scar mobility;Flexibility;FMC    Rehab Potential Good    Clinical Decision Making Limited treatment options, no task modification necessary    Comorbidities Affecting Occupational Performance: None    Modification or Assistance to Complete Evaluation  No modification of tasks or assist necessary to complete eval    OT Frequency 2x / week  OT Duration 8 weeks    OT Treatment/Interventions Self-care/ADL training;Fluidtherapy;Contrast Bath;Therapeutic exercise;Iontophoresis;Paraffin;Manual Therapy;Patient/family education;Passive range of motion;Scar mobilization    Consulted and Agree with Plan of Care Patient             Patient will benefit from skilled therapeutic intervention in order to improve the following deficits and impairments:   Body Structure / Function / Physical Skills: ADL, Strength, Pain, Dexterity, Edema, UE functional use, IADL, ROM, Scar mobility, Flexibility, [facility]       Visit Diagnosis: Scar tissue - Plan: Ot plan of care cert/re-cert  Pain in right hand - Plan: Ot plan of care cert/re-cert  Stiffness of right hand, not elsewhere classified - Plan: Ot plan of care cert/re-cert  Localized edema - Plan: Ot plan of care cert/re-cert    Problem List Patient Active Problem List   Diagnosis Date Noted   Status post vaginal hysterectomy 01/29/2015   HLD (hyperlipidemia) 07/18/2013   Clinical depression 07/18/2013   Female  genuine stress incontinence 05/23/2013   Incomplete bladder emptying 05/23/2013   Excessive urination at night 05/23/2013    Rosalyn Gess, OTR/L,CLT 10/14/2021, 8:02 PM  [facility] [address] [place], [address] Phone: [phone]   Fax:  [phone]  Name: Felicia Cantu MRN: [id number] Date of Birth: April 12, 1960

## 2021-10-19 ENCOUNTER — Ambulatory Visit: Payer: 59 | Admitting: Occupational Therapy

## 2021-10-19 DIAGNOSIS — L905 Scar conditions and fibrosis of skin: Secondary | ICD-10-CM

## 2021-10-19 DIAGNOSIS — R600 Localized edema: Secondary | ICD-10-CM

## 2021-10-19 DIAGNOSIS — M25641 Stiffness of right hand, not elsewhere classified: Secondary | ICD-10-CM

## 2021-10-19 DIAGNOSIS — M79641 Pain in right hand: Secondary | ICD-10-CM

## 2021-10-19 NOTE — Therapy (Signed)
[facility] [address], [place], [address] Phone: [phone]   Fax:  [phone]  Occupational Therapy Treatment  Patient Details  Name: Felicia Cantu MRN: [id number] Date of Birth: Dec 14, 1960 Referring Provider (OT): [place]   Encounter Date: 10/19/2021   OT End of Session - 10/19/21 0903     Visit Number 2    Number of Visits 16    Date for OT Re-Evaluation 12/09/21    OT Start Time 0902    OT Stop Time 0947    OT Time Calculation (min) 45 min    Activity Tolerance Patient tolerated treatment well    Behavior During Therapy [facility] for tasks assessed/performed             Past Medical History:  Diagnosis Date   Breast tenderness    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   Subjective Assessment - 10/19/21 0903     Subjective  Did do my exercises more than 3 x day  so yesterday I backed off little- scar feels softer but tight and some pain up into forearm still    Pertinent History Pt was bit by dog on 09/17/21 on R hand - xray negative for fracture- multiple wounds- was on antibiotics since seen at walk in clinic same day - seen by Ortho on 09/23/21 and then again on 10/07/21 she was referred to OT for desentitization, scar management - has trigger fingers 2nd thru 4th - with pain and stiffness -    Patient Stated Goals I want the pain, motion and strength in my dominant hand  better 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 Yes    Pain Score 6     Pain Location Hand    Pain Orientation Right    Pain Descriptors / Indicators Aching;Tender;Tightness    Pain Type Acute pain    Pain Onset 1 to 4 weeks ago    Pain Frequency Intermittent                [facility] OT Assessment - 10/19/21 0001       AROM   Right Wrist Extension 53 Degrees   Pain on volar wrist and forearm   Right Wrist Flexion 80 Degrees      Right Hand AROM   R Thumb Radial ABduction/ADduction 0-55 58    R Thumb Palmar ABduction/ADduction 0-45 60    R Thumb Opposition to Index --   Opposition with some discomfort and pain in the palm from   R Index  MCP 0-90 90 Degrees    R Index PIP 0-100 100 Degrees    R Long  MCP 0-90 90 Degrees    R Long PIP 0-100 100 Degrees    R Ring  MCP 0-90 90 Degrees    R Ring PIP 0-100 100 Degrees    R Little  MCP 0-90 90 Degrees    R Little PIP 0-100 95 Degrees                      OT Treatments/Exercises (OP) - 10/19/21 0001       RUE Fluidotherapy   Number Minutes Fluidotherapy 12 Minutes    RUE Fluidotherapy Location Hand;Wrist    Comments ice 2 rotations thru fluido      RUE Contrast Bath   Time 8 minutes              Done some light [place] #2 tool for sweeping over volar forearm and wrist as well as volar digits.  Patient with some trigger points and tightness especially in the flexors of the wrist forearm and reports some discomfort in the forearm because of compensating with wrist flexors for stiffness in the digits the last 4 weeks.     Patient to continue with contrast 2-3 times a day to right hand and wrist.  Followed by desensitization and scar massage.  Patient to do about 5 times during the day 10 seconds at a time to 20-30 seconds.  Scar massage over Cica -Care scar pad tolerance better and done by OT. Scar still about the same tenderness but softer and more flexible allowing  more active range of motion in digits. Metacarpal spreads done by OT and family to help patient with that 10 reps as well as webspace massage for thumb radial and palmar abduction.   As well as soft textures and using hand with bathing and dressing and drying with towel. Cica -Care scar pad at nighttime under Isotoner glove fitted.   With Cica -Care scar pad on patient to do rolling over read roller for digit extension and soft tissue massage to volar hand.  Patient can also do soft tissue massage using her palm on volar forearm and using her right roller.    Prior to Tendon glides with lumbrical fist followed by blocked intrinsic fist in composite fist to highlighter out of palm and then palm.  Keeping pain less than 3/10 Patient made great progress in composite flexion as well as edema in digits.  Patient to hold off on any forceful motion or strengthening patient to focus on scar management in desensitization followed by edema and active range of motion keeping pain under 2/10.       OT Education - 10/19/21 0903     Education Details progress and changes to HEP    Person(s) Educated Patient    Methods Explanation;Demonstration;Tactile cues;Verbal cues;Handout    Comprehension Verbalized understanding;Verbal cues required;Returned demonstration              OT Short Term Goals - 10/14/21 1954       OT SHORT TERM GOAL #1   Title Patient to be independent in home program to decrease hypersensitivity to palmar scar to tolerate using hand in bathing and dressing with pain less than 3/10    Baseline Unable to tolerate soft massage and soft textures more than 20 seconds.  Pain increases to a 5-10/10 with scar massage unable to use towel.  Or use any tools in hand.    Time 3    Period Weeks    Status New    Target  Date 11/04/21               OT Long Term Goals - 10/14/21 1955       OT LONG TERM GOAL #1   Title Patient hypersensitivity in the right hand improved for  patient to be able to use hand normally to grip tools, utensils with pain less than 2/10    Baseline Pain and hypersensitivity over palmar scar increased from 5-10/10.  Tolerate soft massage as well as soft textures less than 20 seconds.  Unable to grip any and hand touch and scar.    Time 5    Period Weeks    Status New    Target Date 11/18/21      OT LONG TERM GOAL #2   Title Active range of motion in right hand digits improved to within normal limits for composite flexion as well as full extension with no triggering and able to don and doff gloves and put hand in pocket.    Baseline Patient still triggering of second through fourth with composite fist.  Pain with any active range of motion extension and flexion 5-10/10, MC flexion 82nd digit 93rd through fifth, PIP flexion 90 degrees DIP flexion 30 through 45 degrees.    Time 6    Period Weeks    Status New    Target Date 11/25/21      OT LONG TERM GOAL #3   Title Right wrist active range of motion improved to within normal limits in all planes symptom-free.    Baseline Wrist extension 58 with pain.  Flexion 85 slight pull over dorsal wrist    Time 4    Period Weeks    Status New    Target Date 11/11/21      OT LONG TERM GOAL #4   Title Right hand grip and prehension strength improved to more than 75% compared to left hand to be able to return to prior level of using hand and yard work, crafts and return back to work.    Baseline Unable to use hand in any gripping or prehension pain increases to 10/10, hypersensitivity with scar stiffness in all digits and triggering.  Strength not tested.    Time 8    Period Weeks    Status New    Target Date 12/09/21                   Plan - 10/19/21 7544     Clinical Impression Statement Patient present at OT evaluation 4 1/2 weeks out from dog bite to right hand with multiple puncture wounds.  With large puncture wound and palm.  Patient with hypersensitivity on palmar scar unable  to tolerate any massage or textures.  As well as gripping objects.  Patient with increased pain in wrist extension flexion as well as digit flexion extension including thumb.  Pain at rest 5/10 can increase to 10/10 with range of motion and attempts of functional use of right dominant hand.  Patient present with increased scar tissue with increased edema as well as decreased range of motion and strength.  Patient arrived for first follow-up today patient show increase digit flexion as well as thumb active range of motion with continues discomfort and pain and scar and palmar palm.  Patient compensating with wrist flexion in the last few weeks to compensate for stiffness in the digits.  Did some soft tissue over volar wrist and forearm with some trigger points as well as scar massage and  desensitization to palmar scar.  Patient with trigger fingers to second through fourth digits with tenderness over A1 pulley at eval we will reassess next visit.  Patient can benefit from skilled OT services to decrease scar tissue, desensitization, edema and pain with increased motion and strength to return to prior level of function.    OT Occupational Profile and History Problem Focused Assessment - Including review of records relating to presenting problem    Occupational performance deficits (Please refer to evaluation for details): ADL's;IADL's;Work;Play;Leisure;Social Participation    Body Structure / Function / Physical Skills ADL;Strength;Pain;Dexterity;Edema;UE functional use;IADL;ROM;Scar mobility;Flexibility;FMC    Rehab Potential Good    Clinical Decision Making Limited treatment options, no task modification necessary    Comorbidities Affecting Occupational Performance: None    Modification or Assistance to Complete Evaluation  No modification of tasks or assist necessary to complete eval    OT Frequency 2x / week    OT Duration 8 weeks    OT Treatment/Interventions Self-care/ADL training;Fluidtherapy;Contrast  Bath;Therapeutic exercise;Iontophoresis;Paraffin;Manual Therapy;Patient/family education;Passive range of motion;Scar mobilization    Consulted and Agree with Plan of Care Patient             Patient will benefit from skilled therapeutic intervention in order to improve the following deficits and impairments:   Body Structure / Function / Physical Skills: ADL, Strength, Pain, Dexterity, Edema, UE functional use, IADL, ROM, Scar mobility, Flexibility, [facility]       Visit Diagnosis: Scar tissue  Pain in right hand  Stiffness of right hand, not elsewhere classified  Localized edema    Problem List Patient Active Problem List   Diagnosis Date Noted   Status post vaginal hysterectomy 01/29/2015   HLD (hyperlipidemia) 07/18/2013   Clinical depression 07/18/2013   Female genuine stress incontinence 05/23/2013   Incomplete bladder emptying 05/23/2013   Excessive urination at night 05/23/2013    Rosalyn Gess, OTR/L,CLT 10/19/2021, 11:12 AM  [facility] [address] [place], [address] Phone: [phone]   Fax:  [phone]  Name: Felicia Cantu MRN: [id number] Date of Birth: 1960-03-30

## 2021-10-21 ENCOUNTER — Ambulatory Visit: Payer: 59 | Admitting: Occupational Therapy

## 2021-10-21 DIAGNOSIS — L905 Scar conditions and fibrosis of skin: Secondary | ICD-10-CM

## 2021-10-21 DIAGNOSIS — M79641 Pain in right hand: Secondary | ICD-10-CM

## 2021-10-21 DIAGNOSIS — R600 Localized edema: Secondary | ICD-10-CM

## 2021-10-21 DIAGNOSIS — M25641 Stiffness of right hand, not elsewhere classified: Secondary | ICD-10-CM

## 2021-10-21 NOTE — Therapy (Signed)
[facility] [address] [place], [address] Phone: [phone]   Fax:  [phone]  Occupational Therapy Treatment  Patient Details  Name: Felicia Cantu MRN: [id number] Date of Birth: [birth year] Referring Provider (OT): Tenstrike [place]   Encounter Date: 10/21/2021   OT End of Session - 10/21/21 1451     Visit Number 3    Number of Visits 16    Date for OT Re-Evaluation 12/09/21    OT Start Time 1017    OT Stop Time 1549    OT Time Calculation (min) 58 min    Activity Tolerance Patient tolerated treatment well    Behavior During Therapy [facility] for tasks assessed/performed             Past Medical History:  Diagnosis Date   Breast tenderness    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   Subjective Assessment - 10/21/21 1451     Subjective  My hand and forearm is hurting - my son helped with the massage of my hand , I done the webspace and forearm -I am little bit of overachiever.    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 Yes    Pain Score 8     Pain Location Hand   forearm   Pain Orientation Right    Pain Descriptors / Indicators Aching;Tender;Tightness    Pain Type Acute pain;Surgical pain    Pain Onset More than a month ago    Pain Frequency Constant                [facility] OT Assessment - 10/21/21 0001       AROM   Right Wrist Extension 58 Degrees                      OT Treatments/Exercises (OP) - 10/21/21 0001       Ultrasound   Ultrasound Location 8    Ultrasound Parameters 3.3MHZ, 20%, 0.8 intensity over volar MC,s 1st dorsal compartm, prox  forearm flexor trigger point    Ultrasound Goals Pain      RUE Contrast Bath   Time 8 minutes  Comments decrease pain and edema               Patient with increased pain and tenderness this date with soft tissue as well as range of motion. Patient overdone home program with scar massage as well as soft tissue massage. Patient to hold off on any scar massage and mobilization had great results in scar adhesion in the palm but to tenderness during sensitive. Patient continues to have a trigger point at the proximal volar flexors of the forearm with compensation wrist flexion and digit flexion.  Limiting her wrist extension. Patient tender over first dorsal compartment with pain thumb flexion and radial abduction. Pain with composite flexion of digits and palm and back of fingers.  Patient to hold off on any scar mobilization and soft mobilization Focus on contrast several times during the day and pain-free active range of motion for tendon glides.  As well as thumb range of motion. Pain-free opposition picking up 2 cm foam block alternating digits.  Patient fitted with a thumb wrist wrap to decrease pain with wrist active range of motion and thumb. Patient pain decreased in session from 8/10 to 4/10.            OT Education  - 10/21/21 1451     Education Details progress and changes to HEP    Person(s) Educated Patient    Methods Explanation;Demonstration;Tactile cues;Verbal cues;Handout    Comprehension Verbalized understanding;Verbal cues required;Returned demonstration              OT Short Term Goals - 10/14/21 1954       OT SHORT TERM GOAL #1   Title Patient to be independent in home program to decrease hypersensitivity to palmar scar to tolerate using hand in bathing and dressing with pain less than 3/10    Baseline Unable to tolerate soft massage and soft textures more than 20 seconds.  Pain increases to a 5-10/10 with scar massage unable to use towel.  Or use any tools in hand.    Time 3    Period Weeks    Status New    Target Date 11/04/21               OT Long Term Goals - 10/14/21 1955       OT LONG TERM GOAL #1   Title Patient hypersensitivity in the right hand improved for patient to be able to use hand normally to grip tools, utensils with pain less than 2/10    Baseline Pain and hypersensitivity over palmar scar increased from 5-10/10.  Tolerate soft massage as well as soft textures less than 20 seconds.  Unable to grip any and hand touch and scar.    Time 5    Period Weeks    Status New    Target Date 11/18/21      OT LONG TERM GOAL #2   Title Active range of motion in right hand digits improved to within normal limits for composite flexion as well as full extension with no triggering and able to don and doff gloves and put hand in pocket.    Baseline Patient still triggering of second through fourth with composite fist.  Pain with any active range of motion extension and flexion 5-10/10, MC flexion 82nd digit 93rd through fifth, PIP flexion 90 degrees DIP flexion 30 through 45 degrees.    Time 6    Period Weeks    Status New    Target Date 11/25/21  OT LONG TERM GOAL #3   Title Right wrist active range of motion improved to within normal limits in all planes  symptom-free.    Baseline Wrist extension 58 with pain.  Flexion 85 slight pull over dorsal wrist    Time 4    Period Weeks    Status New    Target Date 11/11/21      OT LONG TERM GOAL #4   Title Right hand grip and prehension strength improved to more than 75% compared to left hand to be able to return to prior level of using hand and yard work, crafts and return back to work.    Baseline Unable to use hand in any gripping or prehension pain increases to 10/10, hypersensitivity with scar stiffness in all digits and triggering.  Strength not tested.    Time 8    Period Weeks    Status New    Target Date 12/09/21                   Plan - 10/21/21 1451     Clinical Impression Statement Patient present at OT evaluation 5 weeks out from dog bite to right hand with multiple puncture wounds.  With large puncture wound and palm.  Patient with hypersensitivity on palmar scar unable to tolerate any massage or textures.  As well as gripping objects.  Patient with increased pain in wrist extension flexion as well as digit flexion extension including thumb.  Pain at rest 5/10 can increase to 10/10 with range of motion and attempts of functional use of right dominant hand.  Patient present with increased scar tissue with increased edema as well as decreased range of motion and strength at eval. patient made great progress in active range of motion for digits including thumb to within normal limits.  Scar tissue adhesion improved greatly as well as wrist flexion extension.  Patient arrived this date with pain for 8/10.  Continued tenderness over first dorsal compartment.  As well as trigger point at volar flexors of forearm.  Limiting wrist extension as well as ulnar radial deviation.  Change patient's home program for the next few days until next appointment to focus on decreasing pain and edema.  Fitted patient with thumb or wrist neoprene wrap to decrease pain of thumb and wrist.  Patient to hold  off on any manual massage including scar massage and maintain active range of motion until next visit. Patient with trigger fingers to second through fourth digits with tenderness over A1 pulley - but not triggering when with flexion of all 4 - only when attempt individually.  Continue with tenderness over A1 pulley.  Patient can benefit from skilled OT services to decrease scar tissue, desensitization, edema and pain with increased motion and strength to return to prior level of function.    OT Occupational Profile and History Problem Focused Assessment - Including review of records relating to presenting problem    Occupational performance deficits (Please refer to evaluation for details): ADL's;IADL's;Work;Play;Leisure;Social Participation    Body Structure / Function / Physical Skills ADL;Strength;Pain;Dexterity;Edema;UE functional use;IADL;ROM;Scar mobility;Flexibility;FMC    Rehab Potential Good    Clinical Decision Making Limited treatment options, no task modification necessary    Comorbidities Affecting Occupational Performance: None    Modification or Assistance to Complete Evaluation  No modification of tasks or assist necessary to complete eval    OT Frequency 2x / week    OT Duration 8 weeks    OT Treatment/Interventions Self-care/ADL training;Fluidtherapy;Contrast  Bath;Therapeutic exercise;Iontophoresis;Paraffin;Manual Therapy;Patient/family education;Passive range of motion;Scar mobilization    Consulted and Agree with Plan of Care Patient             Patient will benefit from skilled therapeutic intervention in order to improve the following deficits and impairments:   Body Structure / Function / Physical Skills: ADL, Strength, Pain, Dexterity, Edema, UE functional use, IADL, ROM, Scar mobility, Flexibility, [facility]       Visit Diagnosis: Scar tissue  Pain in right hand  Stiffness of right hand, not elsewhere classified  Localized edema    Problem List Patient Active  Problem List   Diagnosis Date Noted   Status post vaginal hysterectomy 01/29/2015   HLD (hyperlipidemia) 07/18/2013   Clinical depression 07/18/2013   Female genuine stress incontinence 05/23/2013   Incomplete bladder emptying 05/23/2013   Excessive urination at night 05/23/2013    Rosalyn Gess, OTR/L,CLT 10/21/2021, 4:16 PM  [facility] [address] [place], [address] Phone: [phone]   Fax:  [phone]  Name: Felicia Cantu MRN: [id number] Date of Birth: 11/21/1960

## 2021-10-26 ENCOUNTER — Ambulatory Visit: Payer: 59 | Admitting: Occupational Therapy

## 2021-10-26 ENCOUNTER — Encounter: Payer: 59 | Admitting: Occupational Therapy

## 2021-10-26 DIAGNOSIS — M25641 Stiffness of right hand, not elsewhere classified: Secondary | ICD-10-CM

## 2021-10-26 DIAGNOSIS — M79641 Pain in right hand: Secondary | ICD-10-CM

## 2021-10-26 DIAGNOSIS — L905 Scar conditions and fibrosis of skin: Secondary | ICD-10-CM | POA: Diagnosis not present

## 2021-10-26 DIAGNOSIS — R600 Localized edema: Secondary | ICD-10-CM

## 2021-10-26 NOTE — Therapy (Signed)
[facility] [address] [place], [address] Phone: [phone]   Fax:  [phone]  Occupational Therapy Treatment  Patient Details  Name: Felicia Cantu MRN: [id number] Date of Birth: 12-Jun-1960 Referring Provider (OT): Thayer [place]   Encounter Date: 10/26/2021   OT End of Session - 10/26/21 0848     Visit Number 4    Number of Visits 16    Date for OT Re-Evaluation 12/09/21    OT Start Time 0855    OT Stop Time 0950    OT Time Calculation (min) 55 min    Activity Tolerance Patient tolerated treatment well    Behavior During Therapy [facility] for tasks assessed/performed             Past Medical History:  Diagnosis Date   Breast tenderness    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   Subjective Assessment - 10/26/21 0848     Subjective  Pain better than it was - one side of scar worse than other and pain still going up to my elbow - probably compensating with thumb and L index finger    Pertinent History Pt was bit by dog on 09/17/21 on R hand - xray negative for fracture- multiple wounds- was on antibiotics since seen at walk in clinic same day - seen by Ortho on 09/23/21 and then again on 10/07/21 she was referred to OT for desentitization, scar management - has trigger fingers 2nd thru 4th - with pain and stiffness -    Patient Stated Goals I want the pain, motion and strength in my  dominant hand better 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 Yes    Pain Score 3    4-6 on scar   Pain Location Hand   volar forearm   Pain Orientation Right    Pain Descriptors / Indicators Aching;Tender;Tightness    Pain Type Acute pain    Pain Onset More than a month ago    Pain Frequency Constant                [facility] OT Assessment - 10/26/21 0001       AROM   Right Wrist Extension 60 Degrees    Right Wrist Flexion 85 Degrees      Right Hand AROM   R Index  MCP 0-90 90 Degrees    R Index PIP 0-100 95 Degrees    R Long  MCP 0-90 90 Degrees    R Long PIP 0-100 95 Degrees    R Ring  MCP 0-90 90 Degrees    R Ring PIP 0-100 95 Degrees    R  Little  MCP 0-90 90 Degrees    R Little PIP 0-100 95 Degrees               Patient with quick progress in active range of motion of digits.  Still limited and wrist flexion extension.  Did add this date to light passive motion stretches for wrist extension with elbow to side in neutral and supination position.  5 reps hold 5 seconds only.  Keeping pain under 3/10.          OT Treatments/Exercises (OP) - 10/26/21 0001       Ultrasound   Ultrasound Location med epiconydle , 1st dorsal compartment    Ultrasound Parameters 3.cmhz, 20%, 0.8 int      RUE Contrast Bath   Time 8 minutes    Comments decrease pain adn stiffness               Done some light [place] #2 tool  tolerate brushing light better than  sweeping over volar forearm and wrist. Patient with some trigger points and tightness especially in the flexors of the wrist forearm into med epicondyle and reports some discomfort in the forearm because of compensating with wrist flexors for stiffness in the digits the last 4 weeks. But also with some poor strain over the extensors fighting tightness of flexors.       Patient to continue with contrast 2-3 times a day to right hand and wrist.  Followed by desensitization  and scar massage.  Patient to do about 5 times during the day 10 seconds at a time to 20-30 seconds.  Scar massage over Cica -Care scar pad tolerance better and done by OT. Like metacarpal spreads done by OT-family not to assist yet.  As well as soft textures and using hand with bathing and dressing and drying with towel. Cica -Care scar pad at nighttime under Isotoner glove fitted.   With Cica -Care scar pad on patient to do rolling over read roller for digit extension and soft tissue massage to volar hand.  Patient can also do soft tissue massage using her palm on volar forearm and using her right roller.     Prior to Tendon glides with lumbrical fist followed by blocked intrinsic fist in composite fist to highlighter out of palm and then palm.  Keeping pain less than 3/10-pain did decrease 5-10/10 at eval and NOW  3-6/10  Patient made great progress in composite flexion as well as edema in digits.   Patient compensated fist 4 to 5 weeks with using thumb and most all activities patient with tenderness over distal radius had a positive Finkelstein.  Would recommend iontophoresis with dexamethasone for a few sessions.  Patient to see orthopedics Wednesday ask for a prescription.      OT Education - 10/26/21 0848     Education Details progress and changes to HEP    Person(s) Educated Patient    Methods Explanation;Demonstration;Tactile cues;Verbal cues;Handout    Comprehension Verbalized understanding;Verbal cues required;Returned demonstration              OT Short Term Goals - 10/14/21 1954       OT SHORT TERM GOAL #1   Title Patient to be independent in home program to decrease hypersensitivity to palmar scar to tolerate using hand in bathing and dressing with pain less than 3/10    Baseline Unable to tolerate soft massage and soft textures more than 20 seconds.  Pain increases to a 5-10/10 with scar massage  unable to use towel.  Or use any tools in hand.    Time 3    Period  Weeks    Status New    Target Date 11/04/21               OT Long Term Goals - 10/14/21 1955       OT LONG TERM GOAL #1   Title Patient hypersensitivity in the right hand improved for patient to be able to use hand normally to grip tools, utensils with pain less than 2/10    Baseline Pain and hypersensitivity over palmar scar increased from 5-10/10.  Tolerate soft massage as well as soft textures less than 20 seconds.  Unable to grip any and hand touch and scar.    Time 5    Period Weeks    Status New    Target Date 11/18/21      OT LONG TERM GOAL #2   Title Active range of motion in right hand digits improved to within normal limits for composite flexion as well as full extension with no triggering and able to don and doff gloves and put hand in pocket.    Baseline Patient still triggering of second through fourth with composite fist.  Pain with any active range of motion extension and flexion 5-10/10, MC flexion 82nd digit 93rd through fifth, PIP flexion 90 degrees DIP flexion 30 through 45 degrees.    Time 6    Period Weeks    Status New    Target Date 11/25/21      OT LONG TERM GOAL #3   Title Right wrist active range of motion improved to within normal limits in all planes symptom-free.    Baseline Wrist extension 58 with pain.  Flexion 85 slight pull over dorsal wrist    Time 4    Period Weeks    Status New    Target Date 11/11/21      OT LONG TERM GOAL #4   Title Right hand grip and prehension strength improved to more than 75% compared to left hand to be able to return to prior level of using hand and yard work, crafts and return back to work.    Baseline Unable to use hand in any gripping or prehension pain increases to 10/10, hypersensitivity with scar stiffness in all digits and triggering.  Strength not tested.    Time 8    Period Weeks    Status New    Target Date 12/09/21                   Plan - 10/26/21 0848     Clinical Impression Statement  Patient present at OT evaluation  with dog bite to right hand with multiple puncture wounds on 09/17/21 - pt now 5 1/2 wks out.  With large scar in palm and deep.  Patient with hypersensitivity on palmar scar unable to tolerate any massage or textures.  As well as gripping objects.  Patient with increased pain in wrist extension/ flexion as well as digit flexion extension including thumb - with pain at rest 5/10 can increase to 10/10 with range of motion and attempts of functional use of right dominant hand.  Patient present with increased scar tissue with increased edema as well as decreased range of motion and strength at eval. SInce then pt  made great progress in pain 3-6/10.  Great progress in AROM of digits including thumb to WNL  Scar tissue adhesion improved greatly as  well as wrist flexion/ extension.  Patient has been compensating since injury and prior to therapy using thumb as well as of wrist and forearm flexors and extensors.  Patient with increased tenderness over medial epicondyle as well as flexors of the forearm.  Patient also tender over distal radius head and positive Finkelstein. Fitted patient with thumb or wrist neoprene wrap to decrease pain of thumb and wrist last session.  Triggering of fingers decreasing.  Reinforced with patient again to focus on scar massage and motion more than strength.  And decreasing pain from compensating the last few weeks.  Did request a order for iontophoresis with dexamethasone-patient seen PA talk to Monday Wednesday.   Patient can benefit from skilled OT services to decrease scar tissue, desensitization, edema and pain with increased motion and strength to return to prior level of function.    OT Occupational Profile and History Problem Focused Assessment - Including review of records relating to presenting problem    Occupational performance deficits (Please refer to evaluation for details): ADL's;IADL's;Work;Play;Leisure;Social Participation    Body  Structure / Function / Physical Skills ADL;Strength;Pain;Dexterity;Edema;UE functional use;IADL;ROM;Scar mobility;Flexibility;FMC    Rehab Potential Good    Clinical Decision Making Limited treatment options, no task modification necessary    Comorbidities Affecting Occupational Performance: None    Modification or Assistance to Complete Evaluation  No modification of tasks or assist necessary to complete eval    OT Frequency 2x / week    OT Duration 8 weeks    OT Treatment/Interventions Self-care/ADL training;Fluidtherapy;Contrast Bath;Therapeutic exercise;Iontophoresis;Paraffin;Manual Therapy;Patient/family education;Passive range of motion;Scar mobilization    Consulted and Agree with Plan of Care Patient             Patient will benefit from skilled therapeutic intervention in order to improve the following deficits and impairments:   Body Structure / Function / Physical Skills: ADL, Strength, Pain, Dexterity, Edema, UE functional use, IADL, ROM, Scar mobility, Flexibility, [facility]       Visit Diagnosis: Scar tissue  Pain in right hand  Stiffness of right hand, not elsewhere classified  Localized edema    Problem List Patient Active Problem List   Diagnosis Date Noted   Status post vaginal hysterectomy 01/29/2015   HLD (hyperlipidemia) 07/18/2013   Clinical depression 07/18/2013   Female genuine stress incontinence 05/23/2013   Incomplete bladder emptying 05/23/2013   Excessive urination at night 05/23/2013    Rosalyn Gess, OTR/L,CLT 10/26/2021, 2:52 PM  [facility] [address] [place], [address] Phone: [phone]   Fax:  [phone]  Name: Felicia Cantu MRN: [id number] Date of Birth: January 06, 1961

## 2021-10-27 ENCOUNTER — Other Ambulatory Visit: Payer: Self-pay | Admitting: Family Medicine

## 2021-10-27 DIAGNOSIS — R8290 Unspecified abnormal findings in urine: Secondary | ICD-10-CM | POA: Diagnosis not present

## 2021-10-27 DIAGNOSIS — Z0000 Encounter for general adult medical examination without abnormal findings: Secondary | ICD-10-CM | POA: Diagnosis not present

## 2021-10-27 DIAGNOSIS — Z1231 Encounter for screening mammogram for malignant neoplasm of breast: Secondary | ICD-10-CM

## 2021-10-28 DIAGNOSIS — M65331 Trigger finger, right middle finger: Secondary | ICD-10-CM | POA: Diagnosis not present

## 2021-10-28 DIAGNOSIS — E119 Type 2 diabetes mellitus without complications: Secondary | ICD-10-CM | POA: Diagnosis not present

## 2021-10-28 DIAGNOSIS — Z6830 Body mass index (BMI) 30.0-30.9, adult: Secondary | ICD-10-CM | POA: Diagnosis not present

## 2021-10-28 DIAGNOSIS — W540XXA Bitten by dog, initial encounter: Secondary | ICD-10-CM | POA: Diagnosis not present

## 2021-10-28 DIAGNOSIS — S61259A Open bite of unspecified finger without damage to nail, initial encounter: Secondary | ICD-10-CM | POA: Diagnosis not present

## 2021-10-30 ENCOUNTER — Ambulatory Visit: Payer: 59 | Admitting: Occupational Therapy

## 2021-10-30 DIAGNOSIS — M25641 Stiffness of right hand, not elsewhere classified: Secondary | ICD-10-CM

## 2021-10-30 DIAGNOSIS — R600 Localized edema: Secondary | ICD-10-CM

## 2021-10-30 DIAGNOSIS — M79641 Pain in right hand: Secondary | ICD-10-CM

## 2021-10-30 DIAGNOSIS — L905 Scar conditions and fibrosis of skin: Secondary | ICD-10-CM | POA: Diagnosis not present

## 2021-10-30 NOTE — Therapy (Signed)
[facility] [address] [place], [address] Phone: [phone]   Fax:  [phone]  Occupational Therapy Treatment  Patient Details  Name: Felicia Cantu MRN: [id number] Date of Birth: [birth year] Referring Provider (OT): [place]   Encounter Date: 10/30/2021   OT End of Session - 10/30/21 0924     Visit Number 5    Number of Visits 16    Date for OT Re-Evaluation 12/09/21    OT Start Time 0915    OT Stop Time 1013    OT Time Calculation (min) 58 min    Activity Tolerance Patient tolerated treatment well    Behavior During Therapy [facility] for tasks assessed/performed             Past Medical History:  Diagnosis Date   Breast tenderness    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   Subjective Assessment - 10/30/21 0917     Subjective  Still swelling at thumb and middle finger- hit my middle finger last night and it hurt so bad- still feel pull at the base of thumb and top of hand on pinkie - I am using my hand less than 25%    Pertinent History Pt was bit by dog on 09/17/21 on R hand - xray negative for fracture- multiple wounds- was on antibiotics since seen at walk in clinic same day - seen by Ortho on 09/23/21 and then again on 10/07/21 she was referred to OT for desentitization, scar management - has trigger fingers 2nd thru 4th - with pain and stiffness -    Patient Stated Goals I  want the pain, motion and strength in my dominant hand better 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 Yes    Pain Score 5     Pain Location Hand    Pain Orientation Right    Pain Descriptors / Indicators Tender;Aching    Pain Type Acute pain    Pain Onset More than a month ago    Pain Frequency Constant                [facility] OT Assessment - 10/30/21 0001       AROM   Right Wrist Extension 60 Degrees    Right Wrist Flexion 90 Degrees      Right Hand AROM   R Index  MCP 0-90 85 Degrees    R Index PIP 0-100 90 Degrees    R Long  MCP 0-90 90 Degrees    R Long PIP 0-100 90 Degrees    R Ring  MCP 0-90 90 Degrees    R Ring PIP 0-100 90 Degrees  R Little  MCP 0-90 90 Degrees    R Little PIP 0-100 90 Degrees                  Pt ed on use of ionto and skin check done prior and afterwards - pt tolerated well     OT Treatments/Exercises (OP) - 10/30/21 0001       Iontophoresis   Type of Iontophoresis Dexamethasone    Location R 1st dorsal compartment    Dose med patch 2.0 current    Time 19      RUE Contrast Bath   Time 8 minutes    Comments prior to scar massge and ROM                    Patient to continue with contrast 2-3 times a day to right hand and wrist.  Followed by desensitization and scar massage.  Patient to do about 5 times during the day 10 seconds at a time to 20-30 seconds.  Scar massage over Cica -Care scar pad tolerance better and done by OT. Like metacarpal spreads done by OT-family not to assist yet.  As well as soft textures and using hand with bathing and dressing and drying with towel. Cica -Care scar pad at nighttime under Isotoner glove fitted.  Fitted pt with thumb spica prefab to use most all the time do decrease pain at wrist and 1st dorsal compartment     Prior to Tendon glides with lumbrical fist followed by blocked intrinsic fist in composite fist to palm  Keeping pain less  than 3/10-pain did decrease 5-10/10 at eval and NOW  3-6/10  Patient made great progress in composite flexion as well as edema in digits.   Patient compensated fist 4 to 5 weeks with using thumb and most all activities patient with tenderness over distal radius had a positive Finkelstein.  Would recommend iontophoresis with dexamethasone for a few sessions.               OT Education - 10/30/21 [phone]     Education Details progress and changes to HEP- ed on use of ionto    Person(s) Educated Patient    Methods Explanation;Demonstration;Tactile cues;Verbal cues;Handout    Comprehension Verbalized understanding;Verbal cues required;Returned demonstration              OT Short Term Goals - 10/14/21 1954       OT SHORT TERM GOAL #1   Title Patient to be independent in home program to decrease hypersensitivity to palmar scar to tolerate using hand in bathing and dressing with pain less than 3/10    Baseline Unable to tolerate soft massage and soft textures more than 20 seconds.  Pain increases to a 5-10/10 with scar massage unable to use towel.  Or use any tools in hand.    Time 3    Period Weeks    Status New    Target Date 11/04/21               OT Long Term Goals - 10/14/21 1955       OT LONG TERM GOAL #1   Title Patient hypersensitivity in the right hand improved for patient to be able to use hand normally to grip tools, utensils with pain less than 2/10    Baseline Pain and hypersensitivity over palmar scar increased from 5-10/10.  Tolerate soft massage as well as soft textures less than 20 seconds.  Unable to grip any  and hand touch and scar.    Time 5    Period Weeks    Status New    Target Date 11/18/21      OT LONG TERM GOAL #2   Title Active range of motion in right hand digits improved to within normal limits for composite flexion as well as full extension with no triggering and able to don and doff gloves and put hand in pocket.    Baseline Patient still  triggering of second through fourth with composite fist.  Pain with any active range of motion extension and flexion 5-10/10, MC flexion 82nd digit 93rd through fifth, PIP flexion 90 degrees DIP flexion 30 through 45 degrees.    Time 6    Period Weeks    Status New    Target Date 11/25/21      OT LONG TERM GOAL #3   Title Right wrist active range of motion improved to within normal limits in all planes symptom-free.    Baseline Wrist extension 58 with pain.  Flexion 85 slight pull over dorsal wrist    Time 4    Period Weeks    Status New    Target Date 11/11/21      OT LONG TERM GOAL #4   Title Right hand grip and prehension strength improved to more than 75% compared to left hand to be able to return to prior level of using hand and yard work, crafts and return back to work.    Baseline Unable to use hand in any gripping or prehension pain increases to 10/10, hypersensitivity with scar stiffness in all digits and triggering.  Strength not tested.    Time 8    Period Weeks    Status New    Target Date 12/09/21                   Plan - 10/30/21 6599     Clinical Impression Statement Patient present at OT evaluation  with dog bite to right hand with multiple puncture wounds on 09/17/21 - pt now 6 wks out.  With large scar in palm and deep.  Patient  cont to be tender with scar massage, textures and gripping objects. Scar is not as adhere and more flexible. Pt cont to have pain with wrist flexion and extention end range composite flexion of digits and end range extention. SInce eval  pt  made great progress in pain 3-6/10.  Great progress in AROM of digits including thumb to WNL   Patient has been compensating since injury and prior to therapy using thumb as well as of wrist and forearm flexors and extensors.  Patient with increased tenderness over medial epicondyle as well as flexors of the forearm.  Patient also tender over distal radius head and positive Finkelstein. Fitted patient  with thumb spica splint today and did get order to initiate Ionto with dexamethazone. .  Reinforced with patient again to focus on scar massage and motion more than strength.Patient can benefit from skilled OT services to decrease scar tissue, desensitization, edema and pain with increased motion and strength to return to prior level of function.    OT Occupational Profile and History Problem Focused Assessment - Including review of records relating to presenting problem    Occupational performance deficits (Please refer to evaluation for details): ADL's;IADL's;Work;Play;Leisure;Social Participation    Body Structure / Function / Physical Skills ADL;Strength;Pain;Dexterity;Edema;UE functional use;IADL;ROM;Scar mobility;Flexibility;FMC    Rehab Potential Good    Clinical Decision Making Limited  treatment options, no task modification necessary    Comorbidities Affecting Occupational Performance: None    Modification or Assistance to Complete Evaluation  No modification of tasks or assist necessary to complete eval    OT Frequency 2x / week    OT Duration 8 weeks    OT Treatment/Interventions Self-care/ADL training;Fluidtherapy;Contrast Bath;Therapeutic exercise;Iontophoresis;Paraffin;Manual Therapy;Patient/family education;Passive range of motion;Scar mobilization    Consulted and Agree with Plan of Care Patient             Patient will benefit from skilled therapeutic intervention in order to improve the following deficits and impairments:   Body Structure / Function / Physical Skills: ADL, Strength, Pain, Dexterity, Edema, UE functional use, IADL, ROM, Scar mobility, Flexibility, [facility]       Visit Diagnosis: Pain in right hand  Stiffness of right hand, not elsewhere classified  Localized edema  Scar tissue    Problem List Patient Active Problem List   Diagnosis Date Noted   Status post vaginal hysterectomy 01/29/2015   HLD (hyperlipidemia) 07/18/2013   Clinical depression  07/18/2013   Female genuine stress incontinence 05/23/2013   Incomplete bladder emptying 05/23/2013   Excessive urination at night 05/23/2013    Rosalyn Gess, OTR/L,CLT 10/30/2021, 10:46 AM  [facility] [address], [place], [address] Phone: [phone]   Fax:  [phone]  Name: KELINA BEAUCHAMP MRN: [id number] Date of Birth: 1960/05/27

## 2021-11-03 ENCOUNTER — Ambulatory Visit: Payer: 59 | Admitting: Occupational Therapy

## 2021-11-03 DIAGNOSIS — L905 Scar conditions and fibrosis of skin: Secondary | ICD-10-CM

## 2021-11-03 DIAGNOSIS — R600 Localized edema: Secondary | ICD-10-CM

## 2021-11-03 DIAGNOSIS — M25641 Stiffness of right hand, not elsewhere classified: Secondary | ICD-10-CM

## 2021-11-03 DIAGNOSIS — M79641 Pain in right hand: Secondary | ICD-10-CM

## 2021-11-03 NOTE — Therapy (Signed)
[facility] [address], [place], [address] Phone: [phone]   Fax:  [phone]  Occupational Therapy Treatment  Patient Details  Name: Felicia Cantu MRN: [id number] Date of Birth: 09-05-1960 Referring Provider (OT): [place]   Encounter Date: 11/03/2021   OT End of Session - 11/03/21 0855     Visit Number 6    Number of Visits 16    Date for OT Re-Evaluation 12/09/21    OT Start Time 0853    OT Stop Time 0949    OT Time Calculation (min) 56 min    Activity Tolerance Patient tolerated treatment well    Behavior During Therapy [facility] for tasks assessed/performed             Past Medical History:  Diagnosis Date   Breast tenderness    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   Subjective Assessment - 11/03/21 0854     Subjective  Pain still at base of thumb, and stiffness and tightness over back of fingers making fist- tender over scar but moved to the other side now    Pertinent History Pt was bit by dog on 09/17/21 on R hand - xray negative for fracture- multiple wounds- was on antibiotics since seen at walk in clinic same day - seen by Ortho on 09/23/21 and then again on 10/07/21 she was referred to OT for desentitization, scar management - has trigger fingers 2nd thru 4th - with pain and stiffness -    Patient Stated Goals I want the pain, motion and strength in my dominant hand  better 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 Yes    Pain Score 5     Pain Location Hand    Pain Orientation Right    Pain Descriptors / Indicators Tender;Tightness;Aching;Sore    Pain Type Acute pain    Pain Onset More than a month ago    Pain Frequency Constant              AROM cont to be [facility] for digits flexion, extention and thumb PA and RA  Wrist flexion, ext WFL - but pain end range over dorsal hand and wrist , volar wrist  and forearm Tender still over distal radius head and positive finkelstein          Skin check done prior -no issues -tolerated well and pt to keep on patch for hour afterwards    OT Treatments/Exercises (OP) - 11/03/21 0001       Iontophoresis   Type of Iontophoresis Dexamethasone    Location R 1st dorsal compartment    Dose med patch 2.0 current  Time 19      RUE Contrast Bath   Time 8 minutes    Comments prior to scar massage and soft tissue /ROM               Patient to continue with contrast 2-3 times a day to right hand and wrist.  Followed by desensitization and scar massage.  Patient to do about 5 times during the day 10 seconds at a time to 20-30 seconds.  Scar massage over Cica -Care scar pad tolerance better and done by OT. Scar massage done by OT manually and mini massager over cica scar pad -and this date also done some xtractor lightly with AROM of digits - 4 x    As well as soft textures and using hand with bathing and dressing and drying with towel. Cica -Care scar pad at nighttime under Isotoner glove fitted.   Cont with thumb spica prefab to use most all the time do decrease pain at wrist and 1st dorsal compartment     Prior to Tendon glides with lumbrical fist followed by blocked intrinsic fist in composite fist to palm  Keeping pain less than 3/10-pain did decrease 5-10/10 at eval and NOW  3-6/10  Patient made great progress in composite flexion as well as edema in  digits.   Patient compensated fist 4 to 5 weeks with using thumb and most all activities patient with tenderness over distal radius had a positive Finkelstein.  Would recommend iontophoresis with dexamethasone for a few sessions.    add elbow flexion / ext- 3 positions  10 reps  And wrist AROM in all planes 10 reps       OT Education - 11/03/21 0855     Education Details progress and changes to HEP- ed on use of ionto    Person(s) Educated Patient    Methods Explanation;Demonstration;Tactile cues;Verbal cues;Handout    Comprehension Verbalized understanding;Verbal cues required;Returned demonstration              OT Short Term Goals - 10/14/21 1954       OT SHORT TERM GOAL #1   Title Patient to be independent in home program to decrease hypersensitivity to palmar scar to tolerate using hand in bathing and dressing with pain less than 3/10    Baseline Unable to tolerate soft massage and soft textures more than 20 seconds.  Pain increases to a 5-10/10 with scar massage unable to use towel.  Or use any tools in hand.    Time 3    Period Weeks    Status New    Target Date 11/04/21               OT Long Term Goals - 10/14/21 1955       OT LONG TERM GOAL #1   Title Patient hypersensitivity in the right hand improved for patient to be able to use hand normally to grip tools, utensils with pain less than 2/10    Baseline Pain and hypersensitivity over palmar scar increased from 5-10/10.  Tolerate soft massage as well as soft textures less than 20 seconds.  Unable to grip any and hand touch and scar.    Time 5    Period Weeks    Status New    Target Date 11/18/21      OT LONG TERM GOAL #2   Title Active range of motion in right hand digits improved to within normal limits for composite flexion as well as full extension with  no triggering and able to don and doff gloves and put hand in pocket.    Baseline Patient still triggering of second through fourth with composite  fist.  Pain with any active range of motion extension and flexion 5-10/10, MC flexion 82nd digit 93rd through fifth, PIP flexion 90 degrees DIP flexion 30 through 45 degrees.    Time 6    Period Weeks    Status New    Target Date 11/25/21      OT LONG TERM GOAL #3   Title Right wrist active range of motion improved to within normal limits in all planes symptom-free.    Baseline Wrist extension 58 with pain.  Flexion 85 slight pull over dorsal wrist    Time 4    Period Weeks    Status New    Target Date 11/11/21      OT LONG TERM GOAL #4   Title Right hand grip and prehension strength improved to more than 75% compared to left hand to be able to return to prior level of using hand and yard work, crafts and return back to work.    Baseline Unable to use hand in any gripping or prehension pain increases to 10/10, hypersensitivity with scar stiffness in all digits and triggering.  Strength not tested.    Time 8    Period Weeks    Status New    Target Date 12/09/21                   Plan - 11/03/21 0855     Clinical Impression Statement Patient present at OT evaluation  with dog bite to right hand with multiple puncture wounds on 09/17/21 - pt now 6 1/2 wks out.  With large scar in palm and deep.  Patient  cont to be tender with scar massage, textures and gripping objects. Scar is not as adhere and more flexible. Pt cont to have pain with wrist flexion and extention, end range composite flexion of digits and end range extention. SInce eval  pt  made great progress in pain 3-6/10.  Great progress in AROM of digits including thumb to WNL   Patient has been compensating since injury and prior to therapy using thumb as well as of wrist and forearm flexors and extensors.  Patient with increased tenderness over medial epicondyle as well as flexors of the forearm.  Patient also tender over distal radius head and positive Finkelstein. Fitted patient with thumb spica splint tlast visit and did  get order to initiate Ionto with dexamethazone. 2nd Ionto today  Reinforced with patient again to focus on scar massage and motion more than strength.Patient can benefit from skilled OT services to decrease scar tissue, desensitization, edema and pain with increased motion and strength to return to prior level of function.    OT Occupational Profile and History Problem Focused Assessment - Including review of records relating to presenting problem    Occupational performance deficits (Please refer to evaluation for details): ADL's;IADL's;Work;Play;Leisure;Social Participation    Body Structure / Function / Physical Skills ADL;Strength;Pain;Dexterity;Edema;UE functional use;IADL;ROM;Scar mobility;Flexibility;FMC    Rehab Potential Good    Clinical Decision Making Limited treatment options, no task modification necessary    Comorbidities Affecting Occupational Performance: None    Modification or Assistance to Complete Evaluation  No modification of tasks or assist necessary to complete eval    OT Frequency 2x / week    OT Duration 8 weeks    OT Treatment/Interventions Self-care/ADL training;Fluidtherapy;Contrast Bath;Therapeutic  exercise;Iontophoresis;Paraffin;Manual Therapy;Patient/family education;Passive range of motion;Scar mobilization    Consulted and Agree with Plan of Care Patient             Patient will benefit from skilled therapeutic intervention in order to improve the following deficits and impairments:   Body Structure / Function / Physical Skills: ADL, Strength, Pain, Dexterity, Edema, UE functional use, IADL, ROM, Scar mobility, Flexibility, [facility]       Visit Diagnosis: Pain in right hand  Stiffness of right hand, not elsewhere classified  Localized edema  Scar tissue    Problem List Patient Active Problem List   Diagnosis Date Noted   Status post vaginal hysterectomy 01/29/2015   HLD (hyperlipidemia) 07/18/2013   Clinical depression 07/18/2013   Female genuine  stress incontinence 05/23/2013   Incomplete bladder emptying 05/23/2013   Excessive urination at night 05/23/2013    Rosalyn Gess, OTR/l,CLT 11/03/2021, 9:43 AM  [facility] [address] [place], [address] Phone: [phone]   Fax:  [phone]  Name: DESTINEE TABER MRN: [id number] Date of Birth: Oct 16, 1960

## 2021-11-04 DIAGNOSIS — Z0000 Encounter for general adult medical examination without abnormal findings: Secondary | ICD-10-CM | POA: Diagnosis not present

## 2021-11-04 DIAGNOSIS — E119 Type 2 diabetes mellitus without complications: Secondary | ICD-10-CM | POA: Diagnosis not present

## 2021-11-04 DIAGNOSIS — Z23 Encounter for immunization: Secondary | ICD-10-CM | POA: Diagnosis not present

## 2021-11-04 DIAGNOSIS — R69 Illness, unspecified: Secondary | ICD-10-CM | POA: Diagnosis not present

## 2021-11-04 DIAGNOSIS — E7800 Pure hypercholesterolemia, unspecified: Secondary | ICD-10-CM | POA: Diagnosis not present

## 2021-11-05 ENCOUNTER — Ambulatory Visit: Payer: 59 | Admitting: Occupational Therapy

## 2021-11-05 DIAGNOSIS — M25641 Stiffness of right hand, not elsewhere classified: Secondary | ICD-10-CM

## 2021-11-05 DIAGNOSIS — R600 Localized edema: Secondary | ICD-10-CM

## 2021-11-05 DIAGNOSIS — M79641 Pain in right hand: Secondary | ICD-10-CM

## 2021-11-05 DIAGNOSIS — L905 Scar conditions and fibrosis of skin: Secondary | ICD-10-CM

## 2021-11-05 NOTE — Therapy (Deleted)
[place] [facility] [address], [place], [address] Phone: [phone]   Fax:  [phone]  Occupational Therapy Treatment  Patient Details  Name: Felicia Cantu MRN: [id number] Date of Birth: 1960-10-09 Referring Provider (OT): [place]   Encounter Date: 11/05/2021   OT End of Session - 11/05/21 0905     Visit Number 7    Number of Visits 16    Date for OT Re-Evaluation 12/09/21    OT Start Time 0905    OT Stop Time 0955    OT Time Calculation (min) 50 min    Activity Tolerance Patient tolerated treatment well    Behavior During Therapy [facility] for tasks assessed/performed             Past Medical History:  Diagnosis Date   Breast tenderness    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   Subjective Assessment - 11/05/21 0905     Subjective  Try to use but pain up to elbow , thumb side of hand and wrist. But did do my exercises.    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 Yes    Pain Score 5     Pain Location --   forearm and wrist radial side   Pain Orientation Right    Pain Descriptors / Indicators Tender    Pain Type Acute pain    Pain Onset More than a month ago                                  OT Education - 11/05/21 0905     Education Details progress and changes to HEP- ed on use of ionto    Person(s) Educated Patient    Methods Explanation;Demonstration;Tactile cues;Verbal cues;Handout    Comprehension Verbalized understanding;Verbal cues required;Returned demonstration              OT Short Term Goals - 10/14/21 1954       OT SHORT TERM GOAL #1   Title Patient to be independent in home program to decrease hypersensitivity to palmar scar to tolerate using hand in  bathing and dressing with pain less than 3/10    Baseline Unable to tolerate soft massage and soft textures more than 20 seconds.  Pain increases to a 5-10/10 with scar massage unable to use towel.  Or use any tools in hand.    Time 3    Period Weeks    Status New    Target Date 11/04/21               OT Long Term Goals - 10/14/21 1955       OT LONG TERM GOAL #1   Title Patient hypersensitivity in the right hand improved for patient to be able to use hand normally to grip tools, utensils with pain less than 2/10    Baseline Pain and hypersensitivity over palmar scar increased from 5-10/10.  Tolerate soft massage as well as soft textures less than 20 seconds.  Unable to grip any and hand touch and scar.    Time 5    Period Weeks    Status New    Target Date 11/18/21      OT LONG TERM GOAL #2   Title Active range of motion in right hand digits improved to within normal limits for composite flexion as well as full extension with no triggering and able to don and doff gloves and put hand in pocket.    Baseline Patient still triggering of second through fourth with composite fist.  Pain with any active  range of motion extension and flexion 5-10/10, MC flexion 82nd digit 93rd through fifth, PIP flexion 90 degrees DIP flexion 30 through 45 degrees.    Time 6    Period Weeks    Status New    Target Date 11/25/21      OT LONG TERM GOAL #3   Title Right wrist active range of motion improved to within normal limits in all planes symptom-free.    Baseline Wrist extension 58 with pain.  Flexion 85 slight pull over dorsal wrist    Time 4    Period Weeks    Status New    Target Date 11/11/21      OT LONG TERM GOAL #4   Title Right hand grip and prehension strength improved to more than 75% compared to left hand to be able to return to prior level of using hand and yard work, crafts and return back to work.    Baseline Unable to use hand in any gripping or prehension pain increases to 10/10, hypersensitivity with scar stiffness in all digits and triggering.  Strength not tested.    Time 8    Period Weeks    Status New    Target Date 12/09/21                   Plan - 11/05/21 0906     Clinical Impression Statement Patient present at OT evaluation  with dog bite to right hand with multiple puncture wounds on 09/17/21 - pt now 7 wks out.  With large scar in palm and deep.  Patient  cont to be tender with scar massage, ROM and gripping objects. Scar is not as adhere and more flexible but could tolerate massage, soft textures and rougher textures. Add this date light tapping to scar.  Pt cont to have pain with wrist flexion and extention, end range composite flexion of digits and PROM digits extention. SInce eval  pt  made great progress in pain 3-6/10.  Great progress in AROM of digits including  thumb to WNL   Patient has been compensating since injury and prior to therapy using thumb as well as of wrist and forearm flexors and extensors.  Patient with increased tenderness over  proximal volar elbow , and tender  over distal radius head and positive Finkelstein. Fitted patient with thumb spica  splint  and initiated  Ionto with dexamethazone 3rd  Ionto today  Reinforced with patient again to focus on scar massage and desentitization and motion more than strength.Patient can benefit from skilled OT services to decrease scar tissue, desensitization, edema and pain with increased motion and strength to return to prior level of function.    OT Occupational Profile and History Problem Focused Assessment - Including review of records relating to presenting problem    Occupational performance deficits (Please refer to evaluation for details): ADL's;IADL's;Work;Play;Leisure;Social Participation    Body Structure / Function / Physical Skills ADL;Strength;Pain;Dexterity;Edema;UE functional use;IADL;ROM;Scar mobility;Flexibility;FMC    Rehab Potential Good    Clinical Decision Making Limited treatment options, no task modification necessary    Comorbidities Affecting Occupational Performance: None    Modification or Assistance to Complete Evaluation  No modification of tasks or assist necessary to complete eval    OT Frequency 2x / week    OT Duration 8 weeks    OT Treatment/Interventions Self-care/ADL training;Fluidtherapy;Contrast Bath;Therapeutic exercise;Iontophoresis;Paraffin;Manual Therapy;Patient/family education;Passive range of motion;Scar mobilization    Consulted and Agree with Plan of Care Patient             Patient will benefit from skilled therapeutic intervention in order to improve the following deficits and impairments:   Body Structure / Function / Physical Skills: ADL, Strength, Pain, Dexterity, Edema, UE functional use, IADL, ROM, Scar mobility, Flexibility, [facility]       Visit Diagnosis: Pain in right hand  Stiffness of right hand, not elsewhere classified  Localized edema  Scar tissue    Problem List Patient Active Problem List   Diagnosis Date Noted   Status post vaginal hysterectomy 01/29/2015   HLD (hyperlipidemia) 07/18/2013   Clinical depression  07/18/2013   Female genuine stress incontinence 05/23/2013   Incomplete bladder emptying 05/23/2013   Excessive urination at night 05/23/2013    Rosalyn Gess, OT 11/05/2021, 11:14 AM  [place] [facility] [address] [place], [address] Phone: [phone]   Fax:  [phone]  Name: JONET MATHIES MRN: [id number] Date of Birth: 01/26/1961

## 2021-11-05 NOTE — Therapy (Signed)
[place] [facility] [address], [place], [address] Phone: [phone]   Fax:  [phone]  Occupational Therapy Treatment  Patient Details  Name: Felicia Cantu MRN: [id number] Date of Birth: [birth year] Referring Provider (OT): [place]   Encounter Date: 11/05/2021   OT End of Session - 11/05/21 0905     Visit Number 7    Number of Visits 16    Date for OT Re-Evaluation 12/09/21    OT Start Time 0905    OT Stop Time 0955    OT Time Calculation (min) 50 min    Activity Tolerance Patient tolerated treatment well    Behavior During Therapy [facility] for tasks assessed/performed             Past Medical History:  Diagnosis Date   Breast tenderness    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   Subjective Assessment - 11/05/21 0905     Subjective  Try to use but pain up to elbow , thumb side of hand and wrist. But did do my exercises.    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 Yes    Pain Score 5     Pain Location --   forearm and wrist radial side   Pain Orientation Right    Pain Descriptors / Indicators Tender    Pain Type Acute pain    Pain Onset More than a month ago                       Skin check done - tolerated ionto well and pt to keep patch on for hour afterwards    OT Treatments/Exercises (OP) - 11/05/21 0001       Iontophoresis   Type of Iontophoresis Dexamethasone    Location R 1st dorsal compartment    Dose med patch 2.0 current    Time 19      RUE Contrast Bath   Time 8 minutes    Comments prior to scar massage and volar forearm soft tissue /ROM              Patient to continue with contrast 2-3 times  a day to right hand and wrist.  Followed by desensitization and scar massage.  Pt tolerating light massage, soft and rougher textures better- now adding tapping over scar .   Patient to do about 5 times during the day 10 seconds at a time to 20-30 seconds.  Scar massage over Cica -Care scar pad tolerance better and done by OT. Scar massage done by OT manually and mini massager over cica scar pad -and this date also done some xtractor lightly with AROM of digits - 4 x       Cica -Care scar pad at nighttime under Isotoner glove fitted.   Cont with thumb spica prefab to use most all the time do decrease pain at wrist and 1st dorsal compartment     Prior to Tendon glides with lumbrical fist followed by blocked intrinsic fist in composite fist to palm  Keeping pain less than 3/10-pain did decrease 5-10/10 at eval and NOW  3-6/10  Patient made great progress in composite flexion as well as edema in digits.   Patient compensated fist 4 to 5 weeks with using thumb and most all activities patient with tenderness over distal radius had a positive Finkelstein.  Cont with ionto - tolerating well    cont elbow flexion / ext- 3 positions  10 reps  Add composite nerve glide for wrist and  forearm - when walking out to side - 5 reps- 3-5 x day painfree  And wrist AAROM in all planes 10 reps               OT Education - 11/05/21 0905     Education Details progress and changes to HEP- ed on use of ionto    Person(s) Educated Patient    Methods Explanation;Demonstration;Tactile cues;Verbal cues;Handout    Comprehension Verbalized understanding;Verbal cues required;Returned demonstration              OT Short Term Goals - 10/14/21 1954       OT SHORT TERM GOAL #1   Title Patient to be independent in home program to decrease hypersensitivity to palmar scar to tolerate using hand in bathing and dressing with pain less than 3/10    Baseline Unable to tolerate soft massage and soft textures more than 20 seconds.  Pain increases to a 5-10/10 with scar massage unable to use towel.  Or use any tools in hand.    Time 3    Period Weeks    Status New    Target Date 11/04/21               OT Long Term Goals - 10/14/21 1955       OT LONG TERM GOAL #1   Title Patient hypersensitivity in the right hand improved for patient to be able to use hand normally to grip tools, utensils with pain less than 2/10    Baseline Pain and hypersensitivity over palmar scar increased from 5-10/10.  Tolerate soft massage as well as soft textures less than 20 seconds.  Unable to grip any and hand touch and scar.    Time 5    Period Weeks    Status New    Target Date 11/18/21      OT LONG TERM GOAL #2   Title Active range of motion in right hand digits improved to within normal limits for composite flexion as well as full extension with no triggering and able to don and doff gloves and put hand in pocket.  Baseline Patient still triggering of second through fourth with composite fist.  Pain with any active range of motion extension and flexion 5-10/10, MC flexion 82nd digit 93rd through fifth, PIP flexion 90 degrees DIP flexion 30 through 45 degrees.    Time 6    Period Weeks     Status New    Target Date 11/25/21      OT LONG TERM GOAL #3   Title Right wrist active range of motion improved to within normal limits in all planes symptom-free.    Baseline Wrist extension 58 with pain.  Flexion 85 slight pull over dorsal wrist    Time 4    Period Weeks    Status New    Target Date 11/11/21      OT LONG TERM GOAL #4   Title Right hand grip and prehension strength improved to more than 75% compared to left hand to be able to return to prior level of using hand and yard work, crafts and return back to work.    Baseline Unable to use hand in any gripping or prehension pain increases to 10/10, hypersensitivity with scar stiffness in all digits and triggering.  Strength not tested.    Time 8    Period Weeks    Status New    Target Date 12/09/21                   Plan - 11/05/21 0906     Clinical Impression Statement Patient present at OT evaluation  with dog bite to right hand with multiple puncture wounds on 09/17/21 - pt now 7 wks out.  With large scar in palm and deep.  Patient  cont to be tender with scar massage, ROM and gripping objects. Scar is not as adhere and more flexible but could tolerate massage, soft textures and rougher textures. Add this date light tapping to scar.  Pt cont to have pain with wrist flexion and extention, end range composite flexion of digits and PROM digits extention. SInce eval  pt  made great progress in pain 3-6/10.  Great progress in AROM of digits including thumb to WNL   Patient has been compensating since injury and prior to therapy using thumb as well as of wrist and forearm flexors and extensors.  Patient with increased tenderness over  proximal volar elbow , and tender  over distal radius head and positive Finkelstein. Fitted patient with thumb spica splint  and initiated  Ionto with dexamethazone 3rd  Ionto today  Reinforced with patient again to focus on scar massage and desentitization and motion more than strength.Patient  can benefit from skilled OT services to decrease scar tissue, desensitization, edema and pain with increased motion and strength to return to prior level of function.    OT Occupational Profile and History Problem Focused Assessment - Including review of records relating to presenting problem    Occupational performance deficits (Please refer to evaluation for details): ADL's;IADL's;Work;Play;Leisure;Social Participation    Body Structure / Function / Physical Skills ADL;Strength;Pain;Dexterity;Edema;UE functional use;IADL;ROM;Scar mobility;Flexibility;FMC    Rehab Potential Good    Clinical Decision Making Limited treatment options, no task modification necessary    Comorbidities Affecting Occupational Performance: None    Modification or Assistance to Complete Evaluation  No modification of tasks or assist necessary to complete eval    OT Frequency 2x / week    OT Duration 8 weeks    OT Treatment/Interventions Self-care/ADL training;Fluidtherapy;Contrast Bath;Therapeutic exercise;Iontophoresis;Paraffin;Manual Therapy;Patient/family education;Passive range of motion;Scar mobilization  Consulted and Agree with Plan of Care Patient             Patient will benefit from skilled therapeutic intervention in order to improve the following deficits and impairments:   Body Structure / Function / Physical Skills: ADL, Strength, Pain, Dexterity, Edema, UE functional use, IADL, ROM, Scar mobility, Flexibility, [facility]       Visit Diagnosis: Pain in right hand  Stiffness of right hand, not elsewhere classified  Localized edema  Scar tissue    Problem List Patient Active Problem List   Diagnosis Date Noted   Status post vaginal hysterectomy 01/29/2015   HLD (hyperlipidemia) 07/18/2013   Clinical depression 07/18/2013   Female genuine stress incontinence 05/23/2013   Incomplete bladder emptying 05/23/2013   Excessive urination at night 05/23/2013    Rosalyn Gess,  OTR/L,CLT 11/05/2021, 11:16 AM  [place] [facility] [address], [place], [address] Phone: [phone]   Fax:  [phone]  Name: Felicia Cantu MRN: [id number] Date of Birth: 01/29/1961

## 2021-11-10 ENCOUNTER — Ambulatory Visit: Payer: 59 | Attending: Orthopedic Surgery | Admitting: Occupational Therapy

## 2021-11-10 DIAGNOSIS — R600 Localized edema: Secondary | ICD-10-CM | POA: Insufficient documentation

## 2021-11-10 DIAGNOSIS — L905 Scar conditions and fibrosis of skin: Secondary | ICD-10-CM | POA: Insufficient documentation

## 2021-11-10 DIAGNOSIS — M79641 Pain in right hand: Secondary | ICD-10-CM | POA: Insufficient documentation

## 2021-11-10 DIAGNOSIS — M25641 Stiffness of right hand, not elsewhere classified: Secondary | ICD-10-CM | POA: Insufficient documentation

## 2021-11-10 NOTE — Therapy (Signed)
[facility] [address] [place], [address] Phone: [phone]   Fax:  [phone]  Occupational Therapy Treatment  Patient Details  Name: Felicia Cantu MRN: [id number] Date of Birth: 08-12-1960 Referring Provider (OT): [place]   Encounter Date: 11/10/2021   OT End of Session - 11/10/21 1004     Visit Number 8    Number of Visits 16    Date for OT Re-Evaluation 12/09/21    OT Start Time 0908    OT Stop Time 1010    OT Time Calculation (min) 62 min    Activity Tolerance Patient tolerated treatment well    Behavior During Therapy [facility] for tasks assessed/performed             Past Medical History:  Diagnosis Date   Breast tenderness    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   Subjective Assessment - 11/10/21 1002     Subjective  DOing okay - not able to grip , squeeze or lift anything - done the scar massage - pain up in elbow better - and in forearm    Pertinent History Pt was bit by dog on 09/17/21 on R hand - xray negative for fracture- multiple wounds- was on antibiotics since seen at walk in clinic same day - seen by Ortho on 09/23/21 and then again on 10/07/21 she was referred to OT for desentitization, scar management - has trigger fingers 2nd thru 4th - with pain and stiffness -    Patient Stated Goals I want the pain, motion and strength in my dominant hand better so 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 Yes    Pain Score --   increase still 8 with scar mobs   Pain Location Hand    Pain Orientation Right    Pain Descriptors / Indicators Tightness;Tender    Pain Type Acute pain    Pain Onset More than a month ago    Pain Frequency Intermittent             Pt cont to have some pain into 5th digit from elbow- assess cubital tunnel - pt tender and positive Tinel  Pt ed on positioning - pt cradling her hand on chest a lot or elevate and rest on her head - pt to not flex more than 90 or propping up - including night time  Add and review Ulnar N glide 5 reps  And pt to cont with composite nerve glide  5 reps Stop when feeling pull           Skin check done prior and pt tolerated well - no issues -  pt to keep patch on for hour    OT Treatments/Exercises (OP) - 11/10/21 0001       Iontophoresis   Type of Iontophoresis Dexamethasone    Location R 1st dorsal compartment    Dose med patch 2.0 current    Time 19      RUE Contrast Bath   Time 8 minutes    Comments prior to scar massage , and decrease cubital tunnel symtoms            Patient to continue with contrast 2-3 times a day to right hand and wrist.  Followed by desensitization and scar massage.  Pt tolerating light massage, soft and rougher textures better- now adding tapping over scar  since last week .   Patient to do about 5 times during the day 10 seconds at a time to 20-30 seconds.  Scar massage over Cica -Care scar pad tolerance better and done by OT. Scar massage done by OT manually over coban and mini massager over cica scar pad -and this date also done some xtractor lightly  few times - with AROM of digits - 8 x        Cica -Care scar pad at nighttime under Isotoner glove fitted.   Cont with thumb spica prefab to use most all the time do decrease pain at wrist and 1st dorsal compartment     Prior to Tendon glides with  lumbrical fist followed by blocked intrinsic fist in composite fist to palm  Keeping pain less than 3/10-pain did decrease 5-10/10 at eval and NOW  3-6/10  Patient made great progress in composite flexion as well as edema in digits.   Patient cont with tenderness over distal radius had a positive Finkelstein.  Cont with ionto - tolerating well    cont elbow flexion / ext- 3 positions  10 reps  Add composite nerve glide for wrist and forearm - when walking out to side - 5 reps- 3-5 x day painfree  And wrist AAROM in all planes 10 reps           OT Education - 11/10/21 1004     Education Details progress and changes to HEP- ed on use of ionto    Person(s) Educated Patient    Methods Explanation;Demonstration;Tactile cues;Verbal cues;Handout    Comprehension Verbalized understanding;Verbal cues required;Returned demonstration              OT Short Term Goals - 10/14/21 1954       OT SHORT TERM GOAL #1   Title Patient to be independent in home program to decrease hypersensitivity to palmar scar to tolerate using hand in bathing and dressing with pain less than 3/10    Baseline Unable to tolerate soft massage and soft textures more than 20 seconds.  Pain increases to a 5-10/10 with scar massage unable to use towel.  Or use any tools in hand.    Time 3    Period Weeks    Status New    Target Date 11/04/21               OT Long Term Goals - 10/14/21 1955       OT LONG TERM GOAL #1   Title Patient hypersensitivity in the right hand improved for patient to be able to use hand normally to grip tools, utensils with pain less than 2/10    Baseline Pain and hypersensitivity over palmar scar increased from 5-10/10.  Tolerate soft massage as well as  soft textures less than 20 seconds.  Unable to grip any and hand touch and scar.    Time 5    Period Weeks    Status New    Target Date 11/18/21      OT LONG TERM GOAL #2   Title Active range of motion in right hand digits  improved to within normal limits for composite flexion as well as full extension with no triggering and able to don and doff gloves and put hand in pocket.    Baseline Patient still triggering of second through fourth with composite fist.  Pain with any active range of motion extension and flexion 5-10/10, MC flexion 82nd digit 93rd through fifth, PIP flexion 90 degrees DIP flexion 30 through 45 degrees.    Time 6    Period Weeks    Status New    Target Date 11/25/21      OT LONG TERM GOAL #3   Title Right wrist active range of motion improved to within normal limits in all planes symptom-free.    Baseline Wrist extension 58 with pain.  Flexion 85 slight pull over dorsal wrist    Time 4    Period Weeks    Status New    Target Date 11/11/21      OT LONG TERM GOAL #4   Title Right hand grip and prehension strength improved to more than 75% compared to left hand to be able to return to prior level of using hand and yard work, crafts and return back to work.    Baseline Unable to use hand in any gripping or prehension pain increases to 10/10, hypersensitivity with scar stiffness in all digits and triggering.  Strength not tested.    Time 8    Period Weeks    Status New    Target Date 12/09/21                   Plan - 11/10/21 1005     Clinical Impression Statement Patient present at OT evaluation  with dog bite to right hand with multiple puncture wounds on 09/17/21 - pt now 7 1/2 wks out.  With large scar in palm and deep.  Patient  cont to be tender with scar massage, ROM and gripping objects. Scar is not as adhere and more flexible but can tolerate soft massage, soft textures and rougher textures. Add  last time light tapping to scar.Less pain with wrist and digits motion today - tight feeling and pull. SInce eval  pt  made great progress in pain 3-6/10.  Great progress in AROM of digits including thumb to WNL   Patient has been compensating since injury and prior to therapy using  thumb as well as  cradling hand at chest - resulting in some cubital tunnel symptoms and tender  over distal radius head and positive Wynn [place]lin.Pt wearing  thumb spica splint  and initiated  Ionto with dexamethazone 4th Ionto today for wrist. Ed pt on avoiding cradling hand at chest or propping up elbow   Reinforced with patient again to focus on scar massage and desentitization and motion and plan to initiate strength next week.Patient can benefit from skilled OT services to decrease scar tissue, desensitization, edema and pain with increased motion and strength to return to prior level of function.    OT Occupational Profile and History Problem Focused Assessment - Including review of records relating to presenting problem    Occupational performance deficits (Please refer to evaluation for  details): ADL's;IADL's;Work;Play;Leisure;Social Participation    Body Structure / Function / Physical Skills ADL;Strength;Pain;Dexterity;Edema;UE functional use;IADL;ROM;Scar mobility;Flexibility;FMC    Rehab Potential Good    Clinical Decision Making Limited treatment options, no task modification necessary    Comorbidities Affecting Occupational Performance: None    Modification or Assistance to Complete Evaluation  No modification of tasks or assist necessary to complete eval    OT Frequency 2x / week    OT Duration 8 weeks    OT Treatment/Interventions Self-care/ADL training;Fluidtherapy;Contrast Bath;Therapeutic exercise;Iontophoresis;Paraffin;Manual Therapy;Patient/family education;Passive range of motion;Scar mobilization    Consulted and Agree with Plan of Care Patient             Patient will benefit from skilled therapeutic intervention in order to improve the following deficits and impairments:   Body Structure / Function / Physical Skills: ADL, Strength, Pain, Dexterity, Edema, UE functional use, IADL, ROM, Scar mobility, Flexibility, [facility]       Visit Diagnosis: Pain in right  hand  Stiffness of right hand, not elsewhere classified  Localized edema  Scar tissue    Problem List Patient Active Problem List   Diagnosis Date Noted   Status post vaginal hysterectomy 01/29/2015   HLD (hyperlipidemia) 07/18/2013   Clinical depression 07/18/2013   Female genuine stress incontinence 05/23/2013   Incomplete bladder emptying 05/23/2013   Excessive urination at night 05/23/2013    Rosalyn Gess, OTR/L,CLT 11/10/2021, 10:10 AM  [facility] [address], [place], [address] Phone: [phone]   Fax:  [phone]  Name: JOSEPHINE WOOLDRIDGE MRN: [id number] Date of Birth: Sep 12, 1960

## 2021-11-12 ENCOUNTER — Ambulatory Visit: Payer: 59 | Admitting: Occupational Therapy

## 2021-11-12 DIAGNOSIS — M25641 Stiffness of right hand, not elsewhere classified: Secondary | ICD-10-CM

## 2021-11-12 DIAGNOSIS — R600 Localized edema: Secondary | ICD-10-CM

## 2021-11-12 DIAGNOSIS — L905 Scar conditions and fibrosis of skin: Secondary | ICD-10-CM

## 2021-11-12 DIAGNOSIS — M79641 Pain in right hand: Secondary | ICD-10-CM

## 2021-11-12 NOTE — Therapy (Signed)
[place] [facility] [address], [place], [address] Phone: [phone]   Fax:  [phone]  Occupational Therapy Treatment  Patient Details  Name: Felicia Cantu MRN: [id number] Date of Birth: Apr 26, 1960 Referring Provider (OT): [place]   Encounter Date: 11/12/2021   OT End of Session - 11/12/21 0905     Visit Number 9    Number of Visits 16    Date for OT Re-Evaluation 12/09/21    OT Start Time 0905    OT Stop Time 0955    OT Time Calculation (min) 50 min    Activity Tolerance Patient tolerated treatment well    Behavior During Therapy [facility] for tasks assessed/performed             Past Medical History:  Diagnosis Date   Breast tenderness    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   Subjective Assessment - 11/12/21 0905     Subjective  I kept thumb splint on all the time since lats time -my medication patch underneath since 2-3 days ago - some pain in wrist and forerarm still    Pertinent History Pt was bit by dog on 09/17/21 on R hand - xray negative for fracture- multiple wounds- was on antibiotics since seen at walk in clinic same day - seen by Ortho on 09/23/21 and then again on 10/07/21 she was referred to OT for desentitization, scar management - has trigger fingers 2nd thru 4th - with pain and stiffness -    Patient Stated Goals I want the pain, motion and strength in my dominant  hand better 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 Yes    Pain Score 4     Pain Location Wrist    Pain Orientation Right    Pain Descriptors / Indicators Tightness;Aching;Sore    Pain Type Acute pain    Pain Onset More than a month ago    Pain Frequency Intermittent                [facility] OT Assessment - 11/12/21 0001       Strength   Right Hand Grip (lbs) 36    Right Hand Lateral Pinch 9 lbs    Right Hand 3 Point Pinch 9 lbs    Left Hand Grip (lbs) 56    Left Hand Lateral Pinch 12 lbs    Left Hand 3 Point Pinch 11 lbs                Upon arrival today had patient remove thumb spica and patient was able to turn doorknob open and close.  As well as pull heavy door  open better than pushing.  Pushing little more painful. Patient was able to carry 1 pounds to 3 pounds with very little pain and discomfort.  With 4 pounds patient had some discomfort and pain on the ulnar wrist as well as radial into the forearm. No pain in the hand at the scar.      Skin check done patient tolerating ionto well.  Reminded patient to remove patch in an hour.    OT Treatments/Exercises (OP) - 11/12/21 0001       Iontophoresis   Type of Iontophoresis Dexamethasone    Location R 1st dorsal compartment    Dose med patch 2.0 current    Time 19      RUE Contrast Bath   Time 8 minutes    Comments prior to  soft tissue and ROM               Patient to continue with contrast 2-3 times a day to right hand and wrist.  Followed by desensitization and scar massage.  Pt tolerating light massage, soft and rougher textures better- now adding tapping over scar  since last week . This date added table slides for patient for composite wrist and digit extension.  Patient was actually able to tolerate it in 20 reps. Patient to do table slides prior to nerve glides to decrease discomfort and straining of ECU. Attempted for patient to do a 1 pound  weight for supination and pronation but had discomfort at the ulnar wrist and into the palm after table slides. Also had some discomfort with wrist flexion extension and radial ulnar deviation at first dorsal compartment.  Held off this date on scar massage over Cica -Care scar pad tolerance better and done by OT. Scar massage done by OT manually over coban and mini massager over cica scar pad -and this date also done some xtractor lightly  few times - with AROM of digits - 8 x        Cica -Care scar pad at nighttime under Isotoner glove fitted.   Patient to try an alternate thumb and wrist neoprene wrap with thumb spica prefab -with using thumb spica with activities that cause any radial wrist pain.  Prior to Tendon glides with lumbrical fist followed by blocked intrinsic fist in composite fist to palm  Keeping pain less than 3/10-pain did decrease 5-10/10 at eval and NOW  3-6/10  Patient made great progress in composite flexion as well as edema in digits.   Patient cont with tenderness over distal radius had a positive Finkelstein.  Cont with ionto - tolerating well    cont elbow flexion / ext- 3 positions  10 reps  Add composite nerve glide for wrist and forearm - when walking out to side - 5 reps- 3-5 x day painfree  And wrist AAROM in all planes 10 reps        OT Education - 11/12/21 0905     Education Details progress and changes to HEP- ed on use of ionto    Person(s) Educated Patient    Methods Explanation;Demonstration;Tactile cues;Verbal cues;Handout    Comprehension Verbalized understanding;Verbal cues required;Returned demonstration              OT Short Term Goals - 10/14/21 1954       OT SHORT TERM GOAL #1   Title Patient to be independent in home program to decrease hypersensitivity to palmar scar to tolerate using hand in bathing and dressing with pain less than 3/10  Baseline Unable to tolerate soft massage and soft textures more than 20 seconds.  Pain  increases to a 5-10/10 with scar massage unable to use towel.  Or use any tools in hand.    Time 3    Period Weeks    Status New    Target Date 11/04/21               OT Long Term Goals - 10/14/21 1955       OT LONG TERM GOAL #1   Title Patient hypersensitivity in the right hand improved for patient to be able to use hand normally to grip tools, utensils with pain less than 2/10    Baseline Pain and hypersensitivity over palmar scar increased from 5-10/10.  Tolerate soft massage as well as soft textures less than 20 seconds.  Unable to grip any and hand touch and scar.    Time 5    Period Weeks    Status New    Target Date 11/18/21      OT LONG TERM GOAL #2   Title Active range of motion in right hand digits improved to within normal limits for composite flexion as well as full extension with no triggering and able to don and doff gloves and put hand in pocket.    Baseline Patient still triggering of second through fourth with composite fist.  Pain with any active range of motion extension and flexion 5-10/10, MC flexion 82nd digit 93rd through fifth, PIP flexion 90 degrees DIP flexion 30 through 45 degrees.    Time 6    Period Weeks    Status New    Target Date 11/25/21      OT LONG TERM GOAL #3   Title Right wrist active range of motion improved to within normal limits in all planes symptom-free.    Baseline Wrist extension 58 with pain.  Flexion 85 slight pull over dorsal wrist    Time 4    Period Weeks    Status New    Target Date 11/11/21      OT LONG TERM GOAL #4   Title Right hand grip and prehension strength improved to more than 75% compared to left hand to be able to return to prior level of using hand and yard work, crafts and return back to work.    Baseline Unable to use hand in any gripping or prehension pain increases to 10/10, hypersensitivity with scar stiffness in all digits and triggering.  Strength not tested.    Time 8    Period Weeks    Status New     Target Date 12/09/21                   Plan - 11/12/21 0905     Clinical Impression Statement Patient present at OT evaluation  with dog bite to right hand with multiple puncture wounds on 09/17/21 - pt out 8  wks out.  With large scar in palm and deep.  Scar is not as adhere and more flexible and can tolerate soft massage, soft textures and rougher textures. This date done with pt some activities - could turn doorknob and pull door -but pushing was harder and painfull - could carrry and grip up to 3 lbs but 4 lbs caused some pain at ulnar and radial wrist into forearm. In session after doing some HEP - felt more of  tight feeling and pull in ulnar side of hand than pain. Great progress in AROM  of digits including thumb to WNL   Patient has been compensating since injury and prior to therapy using thumb as well as  cradling hand at chest - resulting in some cubital tunnel symptoms and tender  over distal radius head and positive Finkelstein. Appear pt also strain with ECU during nerve glides. This date assess pt's grip and prehension strenght- doing better than this OT expected - Add table slides for composite wrist and digits extnention - Pt  to alternate if can between  thumb spica splint  and wrist/thumb wrap.  Done today 5th Ionto for wrist. Ed pt on avoiding cradling hand at chest or propping up elbow   Reinforced with patient again to focus on scar massage and desentitization and motion and plan to try initiate strength again next week.Patient can benefit from skilled OT services to decrease scar tissue, desensitization, edema and pain with increased motion and strength to return to prior level of function.    OT Occupational Profile and History Problem Focused Assessment - Including review of records relating to presenting problem    Occupational performance deficits (Please refer to evaluation for details): ADL's;IADL's;Work;Play;Leisure;Social Participation    Body Structure / Function /  Physical Skills ADL;Strength;Pain;Dexterity;Edema;UE functional use;IADL;ROM;Scar mobility;Flexibility;FMC    Rehab Potential Good    Clinical Decision Making Limited treatment options, no task modification necessary    Comorbidities Affecting Occupational Performance: None    Modification or Assistance to Complete Evaluation  No modification of tasks or assist necessary to complete eval    OT Frequency 2x / week    OT Duration 8 weeks    OT Treatment/Interventions Self-care/ADL training;Fluidtherapy;Contrast Bath;Therapeutic exercise;Iontophoresis;Paraffin;Manual Therapy;Patient/family education;Passive range of motion;Scar mobilization    Consulted and Agree with Plan of Care Patient             Patient will benefit from skilled therapeutic intervention in order to improve the following deficits and impairments:   Body Structure / Function / Physical Skills: ADL, Strength, Pain, Dexterity, Edema, UE functional use, IADL, ROM, Scar mobility, Flexibility, [facility]       Visit Diagnosis: Pain in right hand  Stiffness of right hand, not elsewhere classified  Localized edema  Scar tissue    Problem List Patient Active Problem List   Diagnosis Date Noted   Status post vaginal hysterectomy 01/29/2015   HLD (hyperlipidemia) 07/18/2013   Clinical depression 07/18/2013   Female genuine stress incontinence 05/23/2013   Incomplete bladder emptying 05/23/2013   Excessive urination at night 05/23/2013    Rosalyn Gess, OTR/L,CLT 11/12/2021, 11:46 AM  [place] [facility] [address], [place], [address] Phone: [phone]   Fax:  [phone]  Name: Felicia Cantu MRN: [id number] Date of Birth: [birth year]

## 2021-11-17 ENCOUNTER — Ambulatory Visit
Admission: RE | Admit: 2021-11-17 | Discharge: 2021-11-17 | Disposition: A | Discharge status: Home / Self Care | Payer: 59 | Source: Ambulatory Visit | Attending: Family Medicine | Admitting: Family Medicine

## 2021-11-17 DIAGNOSIS — Z1231 Encounter for screening mammogram for malignant neoplasm of breast: Secondary | ICD-10-CM | POA: Diagnosis not present

## 2021-11-18 ENCOUNTER — Ambulatory Visit: Payer: 59 | Admitting: Occupational Therapy

## 2021-11-18 DIAGNOSIS — R600 Localized edema: Secondary | ICD-10-CM

## 2021-11-18 DIAGNOSIS — M79641 Pain in right hand: Secondary | ICD-10-CM | POA: Diagnosis not present

## 2021-11-18 DIAGNOSIS — E119 Type 2 diabetes mellitus without complications: Secondary | ICD-10-CM | POA: Diagnosis not present

## 2021-11-18 DIAGNOSIS — M25641 Stiffness of right hand, not elsewhere classified: Secondary | ICD-10-CM

## 2021-11-18 DIAGNOSIS — Z6830 Body mass index (BMI) 30.0-30.9, adult: Secondary | ICD-10-CM | POA: Diagnosis not present

## 2021-11-18 DIAGNOSIS — L905 Scar conditions and fibrosis of skin: Secondary | ICD-10-CM

## 2021-11-18 DIAGNOSIS — S61259A Open bite of unspecified finger without damage to nail, initial encounter: Secondary | ICD-10-CM | POA: Diagnosis not present

## 2021-11-18 DIAGNOSIS — W540XXA Bitten by dog, initial encounter: Secondary | ICD-10-CM | POA: Diagnosis not present

## 2021-11-18 NOTE — Therapy (Signed)
[facility] [address], [place], [address] Phone: [phone]   Fax:  [phone]  Occupational Therapy Treatment/ 10th visit  Patient Details  Name: Felicia Cantu MRN: [id number] Date of Birth: 09/24/1960 Referring Provider (OT): [place]   Encounter Date: 11/18/2021   OT End of Session - 11/18/21 1920     Visit Number 10    Number of Visits 16    Date for OT Re-Evaluation 12/09/21    OT Start Time 2595    OT Stop Time 1625    OT Time Calculation (min) 55 min    Activity Tolerance Patient tolerated treatment well    Behavior During Therapy [facility] for tasks assessed/performed             Past Medical History:  Diagnosis Date   Breast tenderness    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   Subjective Assessment - 11/18/21 1859     Subjective  My hand was really bothering me after last time.  But I know we done a lot and tested my hand last time.  I seen the doctor this morning.  He out another 4 weeks.    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 Yes    Pain Score 6     Pain Location Hand    Pain Orientation Right    Pain Descriptors / Indicators Aching;Tender;Tightness    Pain Type Acute pain    Pain Onset More than a month ago    Pain Frequency Intermittent                          OT Treatments/Exercises (OP) - 11/18/21 0001       Iontophoresis   Type of Iontophoresis Dexamethasone    Location R 1st dorsal compartment and 3th/4th  A1pulley    Dose med patch 2.0 current    Time 19      RUE Contrast Bath   Time 8 minutes    Comments priro to soft tissue nad ROM              Patient to continue with contrast  2-3 times a day to right hand to elbow.  Followed by desensitization and scar massage.  Pt tolerating light massage, soft and rougher textures better- now adding tapping  and deeper scar massage with open hand .   Patient to do about 5 times during the day 10 seconds at a time to 20-30 seconds.  Scar massage over Cica -Care scar pad tolerance better and done by OT. Scar massage done by OT manually over coban and mini massager over cica scar pad -and this date also done some xtractor lightly  few times - with AROM of digits - 8 x        Cica -Care scar pad at nighttime under Isotoner glove fitted.   Cont with thumb spica prefab to use most all the time do decrease pain at wrist and 1st dorsal compartment     Prior to Tendon glides with lumbrical fist followed by blocked intrinsic fist in composite fist to palm   Pain with AROM tendon glides pain free- but when combine with wrist - increase  Patient made great progress in composite flexion as well as edema in digits.   Patient cont with tenderness over distal radius had a positive Finkelstein.  Cont with ionto - tolerating well  Add this date forearm flexor and extensor stretch- could tolerate 2 positions for  extensors with elbow to side - 5 reps hold 5 sec  But flexor stretch only neutral position 5 reps - hold 5 sec Tender 6/10 over A1pulley of 3rd and 4th - add ionto for trigger fingers this date   Improving with AROM but still tender and locking if individually flex          OT Education - 11/18/21 1920     Education Details progress and changes to HEP- ed on use of ionto    Person(s) Educated Patient    Methods Explanation;Demonstration;Tactile cues;Verbal cues;Handout    Comprehension Verbalized understanding;Verbal cues required;Returned demonstration              OT Short Term Goals - 10/14/21 1954       OT SHORT TERM GOAL #1   Title Patient to be independent in home program to decrease hypersensitivity to palmar scar to tolerate using hand in bathing and dressing with pain less than 3/10    Baseline Unable to tolerate soft massage and soft textures more than 20 seconds.  Pain increases to a 5-10/10 with scar massage unable to use towel.  Or use any tools in hand.    Time 3    Period Weeks    Status New    Target Date 11/04/21               OT Long Term Goals - 10/14/21 1955       OT LONG TERM GOAL #1   Title Patient hypersensitivity in the right hand improved for patient to be able to use hand normally to grip tools, utensils with pain less than 2/10    Baseline Pain and hypersensitivity over palmar scar increased from 5-10/10.  Tolerate soft massage as well as soft textures less than 20 seconds.  Unable to grip any and hand touch and scar.    Time 5    Period Weeks    Status New    Target Date 11/18/21      OT LONG TERM GOAL #2   Title Active range of motion in right hand digits improved to within normal limits for composite flexion as well  as full extension with no triggering and able to don and doff gloves and put hand in pocket.    Baseline Patient still triggering of second through fourth with composite fist.  Pain with any active range of motion  extension and flexion 5-10/10, MC flexion 82nd digit 93rd through fifth, PIP flexion 90 degrees DIP flexion 30 through 45 degrees.    Time 6    Period Weeks    Status New    Target Date 11/25/21      OT LONG TERM GOAL #3   Title Right wrist active range of motion improved to within normal limits in all planes symptom-free.    Baseline Wrist extension 58 with pain.  Flexion 85 slight pull over dorsal wrist    Time 4    Period Weeks    Status New    Target Date 11/11/21      OT LONG TERM GOAL #4   Title Right hand grip and prehension strength improved to more than 75% compared to left hand to be able to return to prior level of using hand and yard work, crafts and return back to work.    Baseline Unable to use hand in any gripping or prehension pain increases to 10/10, hypersensitivity with scar stiffness in all digits and triggering.  Strength not tested.    Time 8    Period Weeks    Status New    Target Date 12/09/21                   Plan - 11/18/21 1920     Clinical Impression Statement Patient present at OT evaluation  with dog bite to right hand with multiple puncture wounds on 09/17/21 - pt out 9  wkst.   Patient seen this date for 10th visit.  Patient made great progress in scar adhesion as well as tenderness tolerating different textures as well as massage.  Patient with composite fist and full extension of digits pain-free.  Patient showed good progress in grip and prehension strength but pain with any resistance.  Patient cannot pick up more than 3 pounds.  Was unable to do wrist exercises with 1 pound weight.  Scar still adhere and tight during composite flexion and extension in combination with the wrist.  Scar still deep adhere and painfull with scar mobs. Because of pain and pt compensating the 1st 4 wks after injury - she has some tightness and tendinitis of 1st dorsal compartment, trigger finger in 3rd and 4th and laterarl epicondylitis.  Change HEP this date for pt  to keep elbow to side with light forearm flexor and extensor stretches - and hold off on table slides. Pt to cont with thumb spica if pain better with it than wrist and thumb wrap.   Done today 6th Ionto for 1st dorsal compartment- but 1st for 3rd/4th A1pulley - tolerated well.  Ed pt on avoiding cradling hand at chest or propping up elbow   Reinforced with patient again to focus on scar massage and desentitization and motion and plan to try initiate strength again when appropriate. Patient can benefit from skilled OT services to decrease scar tissue, desensitization, edema and pain with increased motion and strength to return to prior level of function.    OT Occupational Profile and History Problem Focused Assessment - Including review of records relating to presenting problem    Occupational performance deficits (Please refer to evaluation for details): ADL's;IADL's;Work;Play;Leisure;Social Participation    Body Structure / Function /  Physical Skills ADL;Strength;Pain;Dexterity;Edema;UE functional use;IADL;ROM;Scar mobility;Flexibility;FMC    Rehab Potential Good    Clinical Decision Making Limited treatment options, no task modification necessary    Comorbidities Affecting Occupational Performance: None    Modification or Assistance to Complete Evaluation  No modification of tasks or assist necessary to complete eval    OT Frequency 2x / week    OT Duration 8 weeks    OT Treatment/Interventions Self-care/ADL training;Fluidtherapy;Contrast Bath;Therapeutic exercise;Iontophoresis;Paraffin;Manual Therapy;Patient/family education;Passive range of motion;Scar mobilization    Consulted and Agree with Plan of Care Patient             Patient will benefit from skilled therapeutic intervention in order to improve the following deficits and impairments:   Body Structure / Function / Physical Skills: ADL, Strength, Pain, Dexterity, Edema, UE functional use, IADL, ROM, Scar mobility, Flexibility, [facility]        Visit Diagnosis: Pain in right hand  Stiffness of right hand, not elsewhere classified  Localized edema  Scar tissue    Problem List Patient Active Problem List   Diagnosis Date Noted   Status post vaginal hysterectomy 01/29/2015   HLD (hyperlipidemia) 07/18/2013   Clinical depression 07/18/2013   Female genuine stress incontinence 05/23/2013   Incomplete bladder emptying 05/23/2013   Excessive urination at night 05/23/2013    Rosalyn Gess, OTR/L,CLT 11/18/2021, 7:30 PM  [facility] [address], [place], [address] Phone: [phone]   Fax:  [phone]  Name: GENEVIVE PRINTUP MRN: [id number] Date of Birth: 25-Sep-1960

## 2021-11-20 ENCOUNTER — Ambulatory Visit: Payer: 59 | Admitting: Occupational Therapy

## 2021-11-20 DIAGNOSIS — M79641 Pain in right hand: Secondary | ICD-10-CM | POA: Diagnosis not present

## 2021-11-20 DIAGNOSIS — L905 Scar conditions and fibrosis of skin: Secondary | ICD-10-CM

## 2021-11-20 DIAGNOSIS — M25641 Stiffness of right hand, not elsewhere classified: Secondary | ICD-10-CM

## 2021-11-20 DIAGNOSIS — R600 Localized edema: Secondary | ICD-10-CM

## 2021-11-20 NOTE — Therapy (Signed)
[facility] [address] [place], [address] Phone: [phone]   Fax:  [phone]  Occupational Therapy Treatment  Patient Details  Name: Felicia Cantu MRN: [id number] Date of Birth: [birth year] Referring Provider (OT): [place]   Encounter Date: 11/20/2021   OT End of Session - 11/20/21 0846     Visit Number 11    Number of Visits 16    Date for OT Re-Evaluation 12/09/21    OT Start Time 0833    OT Stop Time 0925    OT Time Calculation (min) 52 min    Activity Tolerance Patient tolerated treatment well    Behavior During Therapy [facility] for tasks assessed/performed             Past Medical History:  Diagnosis Date   Breast tenderness    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   Subjective Assessment - 11/20/21 0845     Subjective  THumb was the hurting more tha last day or 2 - scar okay and forearm - still pain with gripping ,puling or squeezing -and scar massage    Pertinent History Pt was bit by dog on 09/17/21 on R hand - xray negative for fracture- multiple wounds- was on antibiotics since seen at walk in clinic same day - seen by Ortho on 09/23/21 and then again on 10/07/21 she was referred to OT for desentitization, scar management - has trigger fingers 2nd thru 4th - with pain and stiffness -    Patient Stated Goals I want the pain, motion and strength in my dominant hand  better 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 Yes    Pain Score 6    thumb, scar - 3/10 elbow   Pain Location Hand    Pain Orientation Right    Pain Descriptors / Indicators Aching;Tender;Tightness    Pain Type Acute pain    Pain Onset More than a month ago    Pain Frequency Intermittent                          OT Treatments/Exercises (OP) - 11/20/21 0001       Iontophoresis   Type of Iontophoresis Dexamethasone    Location R 1st dorsal compartment and 3th/4th  A1pulley    Dose med patch 1st dorsal ,and thumb A1pulley small patch - 2.0 current    Time 19      RUE Contrast Bath   Time 8 minutes    Comments prior to soft tissue              Patient to continue with contrast  2-3 times a day to right hand to elbow.  Followed by desensitization and scar massage.  Pt tolerating light massage, soft and rougher textures better- now adding tapping  and deeper scar massage with open hand .   Patient to do about 5 times during the day 10 seconds at a time to 20-30 seconds.  Scar massage over Cica -Care scar pad tolerance better and done by OT. Scar massage done by OT manually over coban and mini massager over cica scar pad - Kinestiotaping done for start pattern over palmar scar - x 3 to use over weekend during day - less pain with composite digits extention and flexion- and can do massage over it       Cica -Care scar pad at nighttime under Isotoner glove fitted.   Cont with thumb spica prefab to use most all the time do decrease pain at wrist and 1st dorsal compartment     Prior to Pt with increase pain over thumb this date and stiffness- add PROM to IP and MC of thumb - prior to attempted of AROM  Tendon glides with lumbrical fist followed by blocked intrinsic fist in composite fist to palm   Pain with AROM tendon glides pain free- but when combine with wrist - increase  Patient made great progress in composite  flexion as well as edema in digits.   Patient cont with tenderness over distal radius had a positive Finkelstein.  Cont with ionto - tolerating well  forearm flexor and extensor stretch- could tolerate 2 positions for extensors with elbow to side - 5 reps hold 5 sec  But flexor stretch only neutral position 5 reps - hold 5 sec Tender 6/10 over A1pulley of 3rd and 4th - add ionto for trigger fingers this date   Improving with AROM but still tender and locking if individually flex          OT Education - 11/20/21 1252     Education Details progress and changes to HEP- ed on use of ionto    Person(s) Educated Patient    Methods Explanation;Demonstration;Tactile cues;Verbal cues;Handout    Comprehension Verbalized understanding;Verbal cues required;Returned demonstration              OT Short Term Goals - 10/14/21 1954       OT SHORT TERM GOAL #1   Title Patient to be independent in home program to decrease hypersensitivity to palmar scar to tolerate using hand in bathing and dressing with pain less than 3/10    Baseline Unable to tolerate soft massage and soft textures more than 20 seconds.  Pain increases to a 5-10/10 with scar massage unable to use towel.  Or use any tools in hand.    Time 3    Period Weeks    Status New    Target Date 11/04/21               OT Long Term Goals - 10/14/21 1955       OT LONG TERM GOAL #1   Title Patient hypersensitivity in the right hand improved for patient to be able to use hand normally to grip tools, utensils with pain less than 2/10    Baseline Pain and hypersensitivity over palmar scar increased from 5-10/10.  Tolerate soft massage as well as soft textures less than 20 seconds.  Unable to grip any and hand touch and scar.    Time 5    Period Weeks    Status New  Target Date 11/18/21      OT LONG TERM GOAL #2   Title Active range of motion in right hand digits improved to within normal limits for composite flexion as well  as full extension with no triggering and able to don and doff gloves and put hand in pocket.    Baseline Patient still triggering of second through fourth with composite fist.  Pain with any active range of motion extension and flexion 5-10/10, MC flexion 82nd digit 93rd through fifth, PIP flexion 90 degrees DIP flexion 30 through 45 degrees.    Time 6    Period Weeks    Status New    Target Date 11/25/21      OT LONG TERM GOAL #3   Title Right wrist active range of motion improved to within normal limits in all planes symptom-free.    Baseline Wrist extension 58 with pain.  Flexion 85 slight pull over dorsal wrist    Time 4    Period Weeks    Status New    Target Date 11/11/21      OT LONG TERM GOAL #4   Title Right hand grip and prehension strength improved to more than 75% compared to left hand to be able to return to prior level of using hand and yard work, crafts and return back to work.    Baseline Unable to use hand in any gripping or prehension pain increases to 10/10, hypersensitivity with scar stiffness in all digits and triggering.  Strength not tested.    Time 8    Period Weeks    Status New    Target Date 12/09/21                   Plan - 11/20/21 0846     Clinical Impression Statement Patient present at OT evaluation  with dog bite to right hand with multiple puncture wounds on 09/17/21 - pt out 9  wkst.   Patient seen this date for 10th visit.  Patient made great progress in scar adhesion as well as tenderness tolerating different textures as well as massage.  Patient with composite fist and full extension of digits pain-free.  Patient showed good progress in grip and prehension strength but pain with any resistance.  Patient cannot pick up more than 3 pounds.  Was unable to do wrist exercises with 1 pound weight.  Scar still adhere and tight during composite flexion and extension in combination with the wrist. Did attempt kinesiotape today - 3 x star over palm scar  - pt to try and do over the weekend during day -  Scar still deep adhere and painfull with scar mobs. Because of pain and pt compensating the 1st 4 wks after injury - she has some tightness and tendinitis of 1st dorsal compartment, trigger finger in 3rd and 4th and laterarl epicondylitis.  Change HEP this date for pt to keep elbow to side with light forearm flexor and extensor stretches - and hold off on table slides. Pt to cont with thumb spica if pain better with it than wrist and thumb wrap.  PT to do PROM to thumb IP and MC to prevent stiffness.  Done today 7th Ionto for 1st dorsal compartment- but 1st for 3rd/4th A1pulley - tolerated well.  Ed pt on avoiding cradling hand at chest or propping up elbow   Reinforced with patient again to focus on scar massage and desentitization and motion and plan to try initiate strength again when appropriate.  Patient can benefit from skilled OT services to decrease scar tissue, desensitization, edema and pain with increased motion and strength to return to prior level of function.    OT Occupational Profile and History Problem Focused Assessment - Including review of records relating to presenting problem    Occupational performance deficits (Please refer to evaluation for details): ADL's;IADL's;Work;Play;Leisure;Social Participation    Body Structure / Function / Physical Skills ADL;Strength;Pain;Dexterity;Edema;UE functional use;IADL;ROM;Scar mobility;Flexibility;FMC    Rehab Potential Good    Clinical Decision Making Limited treatment options, no task modification necessary    Comorbidities Affecting Occupational Performance: None    Modification or Assistance to Complete Evaluation  No modification of tasks or assist necessary to complete eval    OT Frequency 2x / week    OT Duration 8 weeks    OT Treatment/Interventions Self-care/ADL training;Fluidtherapy;Contrast Bath;Therapeutic exercise;Iontophoresis;Paraffin;Manual Therapy;Patient/family education;Passive  range of motion;Scar mobilization    Consulted and Agree with Plan of Care Patient             Patient will benefit from skilled therapeutic intervention in order to improve the following deficits and impairments:   Body Structure / Function / Physical Skills: ADL, Strength, Pain, Dexterity, Edema, UE functional use, IADL, ROM, Scar mobility, Flexibility, [facility]       Visit Diagnosis: Pain in right hand  Stiffness of right hand, not elsewhere classified  Localized edema  Scar tissue    Problem List Patient Active Problem List   Diagnosis Date Noted   Status post vaginal hysterectomy 01/29/2015   HLD (hyperlipidemia) 07/18/2013   Clinical depression 07/18/2013   Female genuine stress incontinence 05/23/2013   Incomplete bladder emptying 05/23/2013   Excessive urination at night 05/23/2013    Rosalyn Gess, OTR/L,CLT 11/20/2021, 12:56 PM  [facility] [address], [place], [address] Phone: [phone]   Fax:  [phone]  Name: Felicia Cantu MRN: [id number] Date of Birth: 12/08/1960

## 2021-11-24 ENCOUNTER — Ambulatory Visit: Payer: 59 | Admitting: Occupational Therapy

## 2021-11-24 DIAGNOSIS — M79641 Pain in right hand: Secondary | ICD-10-CM | POA: Diagnosis not present

## 2021-11-24 DIAGNOSIS — M25641 Stiffness of right hand, not elsewhere classified: Secondary | ICD-10-CM

## 2021-11-24 DIAGNOSIS — L905 Scar conditions and fibrosis of skin: Secondary | ICD-10-CM

## 2021-11-24 DIAGNOSIS — R600 Localized edema: Secondary | ICD-10-CM

## 2021-11-24 NOTE — Therapy (Signed)
[facility] [address], [place], [address] Phone: [phone]   Fax:  [phone]  Occupational Therapy Treatment  Patient Details  Name: Felicia Cantu MRN: [id number] Date of Birth: 03/24/1960 Referring Provider (OT): [place]   Encounter Date: 11/24/2021   OT End of Session - 11/24/21 0917     Visit Number 12    Number of Visits 16    Date for OT Re-Evaluation 12/09/21    OT Start Time 0815    OT Stop Time 0901    OT Time Calculation (min) 46 min    Activity Tolerance Patient tolerated treatment well    Behavior During Therapy [facility] for tasks assessed/performed             Past Medical History:  Diagnosis Date   Breast tenderness    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   Subjective Assessment - 11/24/21 0914     Subjective  I tried to do my benefits but it hurt to use my hand and fingers on computer - thumb and back of middle /ring finger worse - forearm little better maybe - tried to eat with my hand and dropped my fork    Pertinent History Pt was bit by dog on 09/17/21 on R hand - xray negative for fracture- multiple wounds- was on antibiotics since seen at walk in clinic same day - seen by Ortho on 09/23/21 and then again on 10/07/21 she was referred to OT for desentitization, scar management - has trigger fingers 2nd thru 4th - with pain and stiffness -    Patient Stated  Goals I want the pain, motion and strength in my dominant hand better 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 Yes    Pain Score 6     Pain Location --   Thumb and posterior 3rd and 4th digits   Pain Orientation Right    Pain Descriptors / Indicators Aching;Tightness    Pain Type Acute pain    Pain Onset More than a month ago    Pain Frequency Intermittent                          OT Treatments/Exercises (OP) - 11/24/21 0001       Ultrasound   Ultrasound Location thumb webspace/thenar/volar forearm    Ultrasound Parameters 3.24mz, 20%, 0.8    Ultrasound Goals Pain      RUE Paraffin   Number Minutes Paraffin 8 Minutes    RUE Paraffin Location Hand   wrist   Comments prior to soft tissue and stretches  Patient to continue at home with contrast 2-3 times a day to right hand to elbow.  Followed by desensitization and scar massage.  Pt tolerating light massage, soft and rougher textures better- now adding tapping  and deeper scar massage with open hand .   Patient to do about 5 times during the day 10 seconds at a time to 20-30 seconds.  Scar massage over Cica -Care scar pad tolerance better and done by OT. Scar massage done by OT manually over coban and mini massager over cica scar pad - digist extended and then will progress with wrist extention  Scar adhere with digits and wrist in extetion -and adhere moving from distal to proximal -and ulnar and radial      Cica -Care scar pad at nighttime under Isotoner glove fitted.   Fitted pt with CMC neoprene to use during day - to see if can decrease stiffness and pain in thumb webspace and thumb thenar eminence      Prior to Pt cont to have increase pain in  thumb this date and stiffness-pain more in webspace and thumb thenar with thumb PROM flexion and RA - less pain with PA  Tendon glides with lumbrical fist followed by blocked intrinsic fist in composite  fist to palm   Pain with AROM tendon glides pain free- but when combine with wrist - increase  Patient made great progress in composite flexion as well as edema in digits.   Cont with forearm flexor and extensor stretch- could tolerate 2 positions for extensors with elbow to side - 5 reps hold 5 sec  But flexor stretch only neutral position 5 reps - hold 5 sec Tender 6/10 over thumb and webspace per pt - and dorsal 3rd and 4th digit-   Improving with AROM but still tender and locking if individually flex         OT Education - 11/24/21 0917     Education Details progress and changes to HEP- ed on use of ionto    Person(s) Educated Patient    Methods Explanation;Demonstration;Tactile cues;Verbal cues;Handout    Comprehension Verbalized understanding;Verbal cues required;Returned demonstration              OT Short Term Goals - 10/14/21 1954       OT SHORT TERM GOAL #1   Title Patient to be independent in home program to decrease hypersensitivity to palmar scar to tolerate using hand in bathing and dressing with pain less than 3/10    Baseline Unable to tolerate soft massage and soft textures more than 20 seconds.  Pain increases to a 5-10/10 with scar massage unable to use towel.  Or use any tools in hand.    Time 3    Period Weeks    Status New    Target Date 11/04/21               OT Long Term Goals - 10/14/21 1955       OT LONG TERM GOAL #1   Title Patient hypersensitivity in the right hand improved for patient to be able to use hand normally to grip tools, utensils with pain less than 2/10    Baseline Pain and hypersensitivity over palmar scar increased from 5-10/10.  Tolerate soft massage as well as soft textures less than 20 seconds.  Unable to grip any and hand touch and scar.    Time 5    Period Weeks    Status New    Target Date 11/18/21  OT LONG TERM GOAL #2   Title Active range of motion in right hand digits improved to within normal limits for  composite flexion as well as full extension with no triggering and able to don and doff gloves and put hand in pocket.    Baseline Patient still triggering of second through fourth with composite fist.  Pain with any active range of motion extension and flexion 5-10/10, MC flexion 82nd digit 93rd through fifth, PIP flexion 90 degrees DIP flexion 30 through 45 degrees.    Time 6    Period Weeks    Status New    Target Date 11/25/21      OT LONG TERM GOAL #3   Title Right wrist active range of motion improved to within normal limits in all planes symptom-free.    Baseline Wrist extension 58 with pain.  Flexion 85 slight pull over dorsal wrist    Time 4    Period Weeks    Status New    Target Date 11/11/21      OT LONG TERM GOAL #4   Title Right hand grip and prehension strength improved to more than 75% compared to left hand to be able to return to prior level of using hand and yard work, crafts and return back to work.    Baseline Unable to use hand in any gripping or prehension pain increases to 10/10, hypersensitivity with scar stiffness in all digits and triggering.  Strength not tested.    Time 8    Period Weeks    Status New    Target Date 12/09/21                   Plan - 11/24/21 2694     Clinical Impression Statement Patient present at OT evaluation  with dog bite to right hand with multiple puncture wounds on 09/17/21 - pt out 9 1/2  wkst.   Patient made great progress in scar adhesion as well as tenderness tolerating different textures as well as massage.  Patient with composite fist and full extension of digits pain-free.  Patient showed good progress in grip and prehension strength but pain with any resistance.  Patient cannot pick up more than 3 pounds.  Was unable to do wrist exercises with 1 pound weight.  Scar still adhere and tight during composite flexion and extension   and increase with combination with the wrist. Did attempt kinesiotape last time but pt report  over the weekend it does not stay on palm well- she has hot flashes.   Scar still deep adhere and painfull with scar mobs. Pt to cont with elbow to side with light forearm flexor and extensor stretches - and hold off on table slides. Pt thumb the last week or 2 had more pain over webspace and thumb - fitted pt with CMC neoprene splint today and weaing out of thumb spica - pt possibly with increase stiffness and fighting thumb spica. Also stopped Ionto - do not feel she is showing enought benfit - done [place] to volar trigger point on forearm and around thumb webspace and thenar eminence today. event stiffness.  Pt to cont  avoiding cradling hand at chest or propping up elbow   Reinforced with patient again to focus on scar massage and desentitization and motion and plan to try initiate strength again when appropriate. Patient can benefit from skilled OT services to decrease scar tissue, desensitization, edema and pain with increased motion and strength to return to prior  level of function.    OT Occupational Profile and History Problem Focused Assessment - Including review of records relating to presenting problem    Occupational performance deficits (Please refer to evaluation for details): ADL's;IADL's;Work;Play;Leisure;Social Participation    Body Structure / Function / Physical Skills ADL;Strength;Pain;Dexterity;Edema;UE functional use;IADL;ROM;Scar mobility;Flexibility;FMC    Rehab Potential Good    Clinical Decision Making Limited treatment options, no task modification necessary    Comorbidities Affecting Occupational Performance: None    Modification or Assistance to Complete Evaluation  No modification of tasks or assist necessary to complete eval    OT Frequency 2x / week    OT Duration 8 weeks    OT Treatment/Interventions Self-care/ADL training;Fluidtherapy;Contrast Bath;Therapeutic exercise;Iontophoresis;Paraffin;Manual Therapy;Patient/family education;Passive range of motion;Scar mobilization     Consulted and Agree with Plan of Care Patient             Patient will benefit from skilled therapeutic intervention in order to improve the following deficits and impairments:   Body Structure / Function / Physical Skills: ADL, Strength, Pain, Dexterity, Edema, UE functional use, IADL, ROM, Scar mobility, Flexibility, [facility]       Visit Diagnosis: Pain in right hand  Stiffness of right hand, not elsewhere classified  Localized edema  Scar tissue    Problem List Patient Active Problem List   Diagnosis Date Noted   Status post vaginal hysterectomy 01/29/2015   HLD (hyperlipidemia) 07/18/2013   Clinical depression 07/18/2013   Female genuine stress incontinence 05/23/2013   Incomplete bladder emptying 05/23/2013   Excessive urination at night 05/23/2013    Rosalyn Gess, OTR/L,LCT 11/24/2021, 9:24 AM  [facility] [address], [place], [address] Phone: [phone]   Fax:  [phone]  Name: Felicia Cantu MRN: [id number] Date of Birth: 1960/07/04

## 2021-11-26 ENCOUNTER — Ambulatory Visit: Payer: 59 | Admitting: Occupational Therapy

## 2021-11-26 DIAGNOSIS — M25641 Stiffness of right hand, not elsewhere classified: Secondary | ICD-10-CM

## 2021-11-26 DIAGNOSIS — M79641 Pain in right hand: Secondary | ICD-10-CM | POA: Diagnosis not present

## 2021-11-26 DIAGNOSIS — R600 Localized edema: Secondary | ICD-10-CM

## 2021-11-26 DIAGNOSIS — L905 Scar conditions and fibrosis of skin: Secondary | ICD-10-CM

## 2021-11-26 NOTE — Therapy (Signed)
[facility] [address], [place], [address] Phone: [phone]   Fax:  [phone]  Occupational Therapy Treatment  Patient Details  Name: Felicia Cantu MRN: [id number] Date of Birth: 1960-07-08 Referring Provider (OT): [place]   Encounter Date: 11/26/2021   OT End of Session - 11/26/21 0839     Visit Number 13    Number of Visits 16    Date for OT Re-Evaluation 12/09/21    OT Start Time 0816    OT Stop Time 0900    OT Time Calculation (min) 44 min    Activity Tolerance Patient tolerated treatment well    Behavior During Therapy [facility] for tasks assessed/performed             Past Medical History:  Diagnosis Date   Breast tenderness    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   Subjective Assessment - 11/26/21 0836     Subjective  It was sore with working that scar last time- touching fingers tips, making fist -and wrist bending down worse- I do try and use it - like to still sleep in splint -and soft splint most of the time during day    Pertinent History Pt was bit by dog on 09/17/21 on R hand - xray negative for fracture- multiple wounds- was on antibiotics since seen at walk in clinic same day - seen by Ortho on 09/23/21 and then again on 10/07/21 she was referred to OT for desentitization, scar management - has trigger fingers 2nd thru 4th - with pain and stiffness -    Patient  Stated Goals I want the pain, motion and strength in my dominant hand better 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 Yes    Pain Score 7    opposition - - 4-6/10 with wrist , flexion , ext ,               [facility] OT Assessment - 11/26/21 0001       Strength   Right Hand Grip (lbs) 25    Right Hand Lateral Pinch 8 lbs    Right Hand 3 Point Pinch 8 lbs                      OT Treatments/Exercises (OP) - 11/26/21 0001       RUE Paraffin   Number Minutes Paraffin 8 Minutes    RUE Paraffin Location Hand   wrist   Comments prior to soft tissue and ROM               Patient to continue with scar massage palmar scar improvement  patient with using Cica -Care scar pad under  Isotoner glove fitted.   Pt to cont wearing CMC neoprene to use during day - to see if can decrease stiffness and pain in thumb webspace and thumb thenar eminence      Prior to Less pain with thumb palmar abduction more pain of the radial abduction but not as much tenderness over first dorsal compartment.    tendon glides with lumbrical fist followed by blocked intrinsic fist in composite fist to palm   Pain with AROM tendon glides pain free- but when combine with wrist - increase  Patient made great progress in composite flexion as well as edema in digits.   Change home program for patient to do table slides again for composite extension no pressure 20 reps and then wrist and finger flexion over the armrest with elbow to side palm down and in neutral 5x5 reps Also add wall slides into slight extension and facilitating of composite nerve glide on the wall stepped away with body 5x5 reps to 3 times a day      OT Education - 11/26/21 0839     Education Details progress and changes to HEP- ed on use of ionto    Person(s) Educated Patient    Methods Explanation;Demonstration;Tactile cues;Verbal cues;Handout    Comprehension Verbalized  understanding;Verbal cues required;Returned demonstration              OT Short Term Goals - 10/14/21 1954       OT SHORT TERM GOAL #1   Title Patient to be independent in home program to decrease hypersensitivity to palmar scar to tolerate using hand in bathing and dressing with pain less than 3/10    Baseline Unable to tolerate soft massage and soft textures more than 20 seconds.  Pain increases to a 5-10/10 with scar massage unable to use towel.  Or use any tools in hand.    Time 3    Period Weeks    Status New    Target Date 11/04/21               OT Long Term Goals - 10/14/21 1955       OT LONG TERM GOAL #1   Title Patient hypersensitivity in the right hand improved for patient to be able to use hand normally to grip tools, utensils with pain less than 2/10    Baseline Pain and hypersensitivity over palmar scar increased from 5-10/10.  Tolerate soft massage as well as soft textures less than 20 seconds.  Unable to grip any and hand touch and scar.    Time 5    Period Weeks    Status New    Target Date 11/18/21      OT LONG TERM GOAL #2   Title Active range of motion in right hand digits improved to within normal limits for composite flexion as well as full extension with no triggering and able to don and doff gloves and put hand in pocket.    Baseline Patient still triggering of second through fourth with composite fist.  Pain with any active range of motion extension and flexion 5-10/10, MC flexion 82nd digit 93rd through fifth, PIP flexion 90 degrees DIP flexion 30 through 45 degrees.    Time 6    Period Weeks    Status New    Target Date 11/25/21      OT LONG TERM GOAL #3   Title Right wrist active range of motion improved to within  normal limits in all planes symptom-free.    Baseline Wrist extension 58 with pain.  Flexion 85 slight pull over dorsal wrist    Time 4    Period Weeks    Status New    Target Date 11/11/21      OT LONG TERM GOAL #4   Title  Right hand grip and prehension strength improved to more than 75% compared to left hand to be able to return to prior level of using hand and yard work, crafts and return back to work.    Baseline Unable to use hand in any gripping or prehension pain increases to 10/10, hypersensitivity with scar stiffness in all digits and triggering.  Strength not tested.    Time 8    Period Weeks    Status New    Target Date 12/09/21                   Plan - 11/26/21 0840     Clinical Impression Statement Patient present at OT evaluation  with dog bite to right hand with multiple puncture wounds on 09/17/21 - pt out 10  wkst.   Patient made great progress in scar adhesion as well as tenderness tolerating different textures as well as massage.  Patient with composite fist and full extension of digits pain-free.  Patient showed good progress in grip and prehension strength but pain with any resistance.  Patient cannot pick up more than 3 pounds.  Was unable to do wrist exercises with 1 pound weight.  Scar still adhere and tight during composite flexion and extension   and increase with combination with the wrist. Did attempt kinesiotape last time but pt report over the weekend it does not stay on palm well- she has hot flashes.   Scar still deep adhere and painfull with scar mobs.  Patient to continue with composite wrist and hand flexion over armrest with elbow to side.  Wrist extension in combination with finger extension at table slides back to patient as well as wall slides for composite nerve glide.  Done [place] to volar trigger point on forearm and around thumb webspace and thenar eminence today..  Pt to cont  avoiding cradling hand at chest or propping up elbow   Reinforced with patient again to focus on scar massage and desentitization and motion and plan to try initiate strength again when appropriate. Patient can benefit from skilled OT services to decrease scar tissue, desensitization, edema and pain with  increased motion and strength to return to prior level of function.    OT Occupational Profile and History Problem Focused Assessment - Including review of records relating to presenting problem    Occupational performance deficits (Please refer to evaluation for details): ADL's;IADL's;Work;Play;Leisure;Social Participation    Body Structure / Function / Physical Skills ADL;Strength;Pain;Dexterity;Edema;UE functional use;IADL;ROM;Scar mobility;Flexibility;FMC    Rehab Potential Good    Clinical Decision Making Limited treatment options, no task modification necessary    Comorbidities Affecting Occupational Performance: None    Modification or Assistance to Complete Evaluation  No modification of tasks or assist necessary to complete eval    OT Frequency 2x / week    OT Duration 8 weeks    OT Treatment/Interventions Self-care/ADL training;Fluidtherapy;Contrast Bath;Therapeutic exercise;Iontophoresis;Paraffin;Manual Therapy;Patient/family education;Passive range of motion;Scar mobilization    Consulted and Agree with Plan of Care Patient             Patient will benefit from skilled therapeutic intervention in order to improve the following deficits  and impairments:   Body Structure / Function / Physical Skills: ADL, Strength, Pain, Dexterity, Edema, UE functional use, IADL, ROM, Scar mobility, Flexibility, [facility]       Visit Diagnosis: Pain in right hand  Stiffness of right hand, not elsewhere classified  Localized edema  Scar tissue    Problem List Patient Active Problem List   Diagnosis Date Noted   Status post vaginal hysterectomy 01/29/2015   HLD (hyperlipidemia) 07/18/2013   Clinical depression 07/18/2013   Female genuine stress incontinence 05/23/2013   Incomplete bladder emptying 05/23/2013   Excessive urination at night 05/23/2013    Rosalyn Gess, OTR/L,CLT 11/26/2021, 11:06 PM  [facility] [address] [place], [address] Phone: [phone]   Fax:  [phone]  Name: DEEANNA BEIGHTOL MRN: [id number] Date of Birth: 11/25/1960

## 2021-11-30 ENCOUNTER — Ambulatory Visit: Payer: 59 | Admitting: Occupational Therapy

## 2021-11-30 DIAGNOSIS — M79641 Pain in right hand: Secondary | ICD-10-CM | POA: Diagnosis not present

## 2021-11-30 DIAGNOSIS — R600 Localized edema: Secondary | ICD-10-CM

## 2021-11-30 DIAGNOSIS — M25641 Stiffness of right hand, not elsewhere classified: Secondary | ICD-10-CM

## 2021-11-30 DIAGNOSIS — L905 Scar conditions and fibrosis of skin: Secondary | ICD-10-CM

## 2021-11-30 NOTE — Therapy (Signed)
[place] [address], [place], [address] Phone: [phone]   Fax:  [phone]  Occupational Therapy Treatment  Patient Details  Name: Felicia Cantu MRN: [id number] Date of Birth: 10-Jan-1961 Referring Provider (OT): [place]   Encounter Date: 11/30/2021   OT End of Session - 11/30/21 0818     Visit Number 14    Number of Visits 16    Date for OT Re-Evaluation 12/09/21    OT Start Time 0818    OT Stop Time 0901    OT Time Calculation (min) 43 min    Activity Tolerance Patient tolerated treatment well    Behavior During Therapy [facility] for tasks assessed/performed             Past Medical History:  Diagnosis Date   Breast tenderness    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   Subjective Assessment - 11/30/21 0818     Subjective  Tried to use it more but the hand and top of hand and thumb still hurts - stabbing pain at times and have to drop things or let go of tnigs    Pertinent History Pt was bit by dog on 09/17/21 on R hand - xray negative for fracture- multiple wounds- was on antibiotics since seen at walk in clinic same day - seen by Ortho on 09/23/21 and then again on 10/07/21 she was referred to OT for desentitization, scar management - has trigger fingers 2nd thru 4th - with pain and stiffness -    Patient Stated Goals I want the pain, motion and strength in my dominant hand  better 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 Yes    Pain Score 4     Pain Location Hand    Pain Orientation Right    Pain Descriptors / Indicators Aching;Stabbing;Tender;Shooting    Pain Type Acute pain    Pain Onset More than a month ago    Pain Frequency Intermittent                [facility] OT Assessment - 11/30/21 0001       AROM   Right Wrist Extension 70 Degrees    Right Wrist Flexion 90 Degrees      Strength   Right Hand Lateral Pinch 6 lbs    Right Hand 3 Point Pinch 6 lbs      Right Hand AROM   R Index  MCP 0-90 90 Degrees    R Index PIP 0-100 100 Degrees    R Long  MCP 0-90 90 Degrees    R Long PIP 0-100 100 Degrees    R Ring  MCP 0-90 90 Degrees    R Ring PIP 0-100 100 Degrees    R Little  MCP 0-90 90 Degrees    R Little PIP 0-100 100 Degrees              Pt report she is trying to use hand more- weaning out of thumb spica - wearing less than 50% of time  And CMC neoprene on most of time for thumb discomfort and pain  Pt with 4-6/10 pain over volar and dorsal wrist during wrist flexion and extention with fisting and extention of digits  With fisting and extention of digits - pain over volar wrist and dorsal hand         OT Treatments/Exercises (OP) - 11/30/21 0001       RUE Paraffin   Number Minutes Paraffin 8 Minutes    RUE Paraffin Location Hand   wrist   Comments Prior to soft tissue / ROM               Patient to continue with scar massage palmar scar improvement patient with using Cica -Care scar pad under  Isotoner glove fitted.   Pt to cont wearing CMC neoprene to use during day - to see if can decrease stiffness and pain in thumb webspace and thumb thenar eminence      Prior to      This date focus on manual for volar and dorsal wrist and forearm - as well as digits and webspace -and MC  spreads - prior  table slides prior to  composite extension no pressure 20 reps and then  wrist and finger flexion over the armrest with elbow to side palm down and in neutral 5x5 reps Initiate 1 lbs for wrist RD, UD over armrest - pain free -12 reps Pain at side of body standing when attempted  Sup /pro pain when unsupported in thumb thenar/ ulnar hand and med prox forearm flexors  But pain free supported and focusing on maintaining wrist ext - 12 reps- mod v/c  2 x day -with stretches for flexors inbetween  Done kinesiotape to dorsal hand at scar tissue- to assess if less pain during HEP - could not keep tape on in palm          OT Education - 11/30/21 0818     Education Details progress and changes to HEP- ed on use of ionto    Person(s) Educated Patient    Methods Explanation;Demonstration;Tactile cues;Verbal cues;Handout    Comprehension Verbalized understanding;Verbal cues required;Returned demonstration              OT Short Term Goals - 10/14/21 1954       OT SHORT TERM GOAL #1   Title Patient to be independent in home program to decrease hypersensitivity to palmar scar to tolerate using hand in bathing and dressing with pain less than 3/10    Baseline Unable to tolerate soft massage and soft textures more than 20 seconds.  Pain increases to a 5-10/10 with scar massage unable to use towel.  Or use any tools in hand.    Time 3    Period Weeks    Status New    Target Date 11/04/21               OT Long Term Goals - 10/14/21 1955       OT LONG TERM GOAL #1   Title Patient hypersensitivity in the right hand improved for patient to be able to  use hand normally to grip tools, utensils with pain less than 2/10    Baseline Pain and hypersensitivity over palmar scar increased from 5-10/10.  Tolerate soft massage as well as soft textures less than 20 seconds.  Unable to grip any and hand touch and scar.    Time 5    Period Weeks    Status New    Target Date 11/18/21      OT LONG TERM GOAL #2   Title Active range of motion in right hand digits  improved to within normal limits for composite flexion as well as full extension with no triggering and able to don and doff gloves and put hand in pocket.    Baseline Patient still triggering of second through fourth with composite fist.  Pain with any active range of motion extension and flexion 5-10/10, MC flexion 82nd digit 93rd through fifth, PIP flexion 90 degrees DIP flexion 30 through 45 degrees.    Time 6    Period Weeks    Status New    Target Date 11/25/21      OT LONG TERM GOAL #3   Title Right wrist active range of motion improved to within normal limits in all planes symptom-free.    Baseline Wrist extension 58 with pain.  Flexion 85 slight pull over dorsal wrist    Time 4    Period Weeks    Status New    Target Date 11/11/21      OT LONG TERM GOAL #4   Title Right hand grip and prehension strength improved to more than 75% compared to left hand to be able to return to prior level of using hand and yard work, crafts and return back to work.    Baseline Unable to use hand in any gripping or prehension pain increases to 10/10, hypersensitivity with scar stiffness in all digits and triggering.  Strength not tested.    Time 8    Period Weeks    Status New    Target Date 12/09/21                   Plan - 11/30/21 0818     Clinical Impression Statement Patient present at OT evaluation  with dog bite to right hand with multiple puncture wounds on 09/17/21 - pt out 10 1/2  wkst.   Patient made great progress in scar adhesion as well as tenderness tolerating different textures as well as massage.  Patient with composite fist and full extension of digits - in combination of wrist flexion and extention - pain between 4-6/10 with pull over dorsal and volar wrist - and into med epicondyle. Pt cont to be tender with most pain over volar and dorsal hand at scar. Pt also with some discomfort and pain over thumb and ulnar hand during attempted of strengthening. Grip and prehension  strength decrease the last 2 times. With sup/pro pt dropping wrist into flexion - Did initated 1 lbs for UD and RD over armrest with less pain and CMC neoprene splint on -and sup/pro 1 lbs but supported. Pt to do table slides for composite extention inbetween HEP. Can wrist stretches - and scar massage/desentitization. Patient can benefit from skilled OT services to decrease scar tissue, desensitization, edema and pain with increased motion and strength to return to prior level of function.    OT Occupational Profile and History Problem Focused Assessment - Including review of records relating to presenting problem    Occupational performance deficits (Please  refer to evaluation for details): ADL's;IADL's;Work;Play;Leisure;Social Participation    Body Structure / Function / Physical Skills ADL;Strength;Pain;Dexterity;Edema;UE functional use;IADL;ROM;Scar mobility;Flexibility;FMC    Rehab Potential Good    Clinical Decision Making Limited treatment options, no task modification necessary    Comorbidities Affecting Occupational Performance: None    Modification or Assistance to Complete Evaluation  No modification of tasks or assist necessary to complete eval    OT Frequency 2x / week    OT Duration 8 weeks    OT Treatment/Interventions Self-care/ADL training;Fluidtherapy;Contrast Bath;Therapeutic exercise;Iontophoresis;Paraffin;Manual Therapy;Patient/family education;Passive range of motion;Scar mobilization    Consulted and Agree with Plan of Care Patient             Patient will benefit from skilled therapeutic intervention in order to improve the following deficits and impairments:   Body Structure / Function / Physical Skills: ADL, Strength, Pain, Dexterity, Edema, UE functional use, IADL, ROM, Scar mobility, Flexibility, [facility]       Visit Diagnosis: Pain in right hand  Stiffness of right hand, not elsewhere classified  Scar tissue  Localized edema    Problem List Patient Active  Problem List   Diagnosis Date Noted   Status post vaginal hysterectomy 01/29/2015   HLD (hyperlipidemia) 07/18/2013   Clinical depression 07/18/2013   Female genuine stress incontinence 05/23/2013   Incomplete bladder emptying 05/23/2013   Excessive urination at night 05/23/2013    Rosalyn Gess, OTR/L,CLT 11/30/2021, 12:22 PM  [place] [facility] [address] [place], [address] Phone: [phone]   Fax:  [phone]  Name: Felicia Cantu MRN: [id number] Date of Birth: 1960-03-13

## 2021-12-03 ENCOUNTER — Ambulatory Visit: Payer: 59 | Admitting: Occupational Therapy

## 2021-12-03 DIAGNOSIS — M79641 Pain in right hand: Secondary | ICD-10-CM

## 2021-12-03 DIAGNOSIS — R600 Localized edema: Secondary | ICD-10-CM

## 2021-12-03 DIAGNOSIS — L905 Scar conditions and fibrosis of skin: Secondary | ICD-10-CM

## 2021-12-03 DIAGNOSIS — M25641 Stiffness of right hand, not elsewhere classified: Secondary | ICD-10-CM

## 2021-12-03 NOTE — Therapy (Signed)
[place] [facility] [address] [place], [address] Phone: [phone]   Fax:  [phone]  Occupational Therapy Treatment  Patient Details  Name: Felicia Cantu MRN: [id number] Date of Birth: 10-23-1960 Referring Provider (OT): [place]   Encounter Date: 12/03/2021   OT End of Session - 12/03/21 0834     Visit Number 15    Number of Visits 16    Date for OT Re-Evaluation 12/09/21    OT Start Time 0817    OT Stop Time 0900    OT Time Calculation (min) 43 min    Activity Tolerance Patient tolerated treatment well    Behavior During Therapy [facility] for tasks assessed/performed             Past Medical History:  Diagnosis Date   Breast tenderness    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   Subjective Assessment - 12/03/21 0831     Subjective  Pain little better with motion but some zinging on side of my wrist and forearm on thumb side - tried to use can opener with 3 cans and hurt really bad - tape on top of hand felt better making fist    Pertinent History Pt was bit by dog on 09/17/21 on R hand - xray negative for fracture- multiple wounds- was on antibiotics since seen at walk in clinic same day - seen by Ortho on 09/23/21 and then again on 10/07/21 she was referred to OT for desentitization, scar management - has trigger fingers 2nd thru 4th - with pain and stiffness -    Patient Stated  Goals I want the pain, motion and strength in my dominant hand better 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 Yes    Pain Score 3     Pain Location Hand    Pain Orientation Right    Pain Descriptors / Indicators Aching;Tender;Tightness    Pain Type Acute pain    Pain Onset More than a month ago    Pain Frequency Intermittent                  Reviewed with patient composite fist as well as opposition.  Composite wrist flexion with a fist as well as composite wrist and hand extension. This date patient's pain was less than 3/10 with pull on radial wrist and forearm with a questional radial sensory branch irritation  Also some discomfort in the ulnar hand into the flexors of the forearm on the medial side. As well as a pull over the dorsal fourth and third digits of hand.          OT Treatments/Exercises (OP) -  12/03/21 0001       RUE Fluidotherapy   Number Minutes Fluidotherapy 11 Minutes    RUE Fluidotherapy Location Hand;Wrist    Comments Done 2 x 1 min ice for contrast - AROM for wrist and digtis               Patient to continue with scar massage palmar scar improvement patient with using Cica -Care scar pad under  Isotoner glove fitted.   Pt to cont wearing CMC neoprene to use during day - to see if can decrease stiffness and pain in thumb webspace and thumb thenar eminence      Prior to     table slides prior to  composite extension no pressure 20 reps and then wrist and finger flexion over the armrest with elbow to side palm down and in neutral 5x5 reps Upgrade to 2 sets of 1 lbs for wrist RD, UD over armrest - pain free -12 reps Pain at side of body standing when attempted still Sup /pro pain when unsupported in thumb thenar/ ulnar hand and med prox forearm flexors this date too But able to do 2 sets of 12 when supported  Can increase to 3rd set Sunday if pain free  2 x day -with stretches for flexors  inbetween   Done kinesiotape to dorsal hand at scar tissue- Pt report less pain when making fist and composite extention Discussed with patient to the difference between feeling a heavy feeling , tired feeling when working out VS pain and discomfort. Appear this date patient feels more like a heavy, tired feeling working out feeling in the forearm and the wrist and pain.           OT Education - 12/03/21 0834     Education Details progress and changes to HEP    Person(s) Educated Patient    Methods Explanation;Demonstration;Tactile cues;Verbal cues;Handout    Comprehension Verbalized understanding;Verbal cues required;Returned demonstration              OT Short Term Goals - 10/14/21 1954       OT SHORT TERM GOAL #1   Title Patient to be independent in home program to decrease hypersensitivity to palmar scar to tolerate using hand in bathing and dressing with pain less than 3/10    Baseline Unable to tolerate soft massage and soft textures more than 20 seconds.  Pain increases to a 5-10/10 with scar massage unable to use towel.  Or use any tools in hand.    Time 3    Period Weeks    Status New    Target Date 11/04/21               OT Long Term Goals - 10/14/21 1955       OT LONG TERM GOAL #1   Title Patient hypersensitivity in the right hand improved for patient to be able to use hand normally to grip tools, utensils with pain less than 2/10    Baseline Pain and hypersensitivity over palmar scar increased from 5-10/10.  Tolerate soft massage as well as soft textures less than 20 seconds.  Unable to grip any and hand touch and scar.    Time 5    Period Weeks    Status New    Target Date 11/18/21      OT LONG TERM GOAL #2   Title Active range of motion in right hand digits improved to within normal limits for composite flexion as well as full  extension with no triggering and able to don and doff gloves and put hand in pocket.    Baseline Patient still triggering  of second through fourth with composite fist.  Pain with any active range of motion extension and flexion 5-10/10, MC flexion 82nd digit 93rd through fifth, PIP flexion 90 degrees DIP flexion 30 through 45 degrees.    Time 6    Period Weeks    Status New    Target Date 11/25/21      OT LONG TERM GOAL #3   Title Right wrist active range of motion improved to within normal limits in all planes symptom-free.    Baseline Wrist extension 58 with pain.  Flexion 85 slight pull over dorsal wrist    Time 4    Period Weeks    Status New    Target Date 11/11/21      OT LONG TERM GOAL #4   Title Right hand grip and prehension strength improved to more than 75% compared to left hand to be able to return to prior level of using hand and yard work, crafts and return back to work.    Baseline Unable to use hand in any gripping or prehension pain increases to 10/10, hypersensitivity with scar stiffness in all digits and triggering.  Strength not tested.    Time 8    Period Weeks    Status New    Target Date 12/09/21                   Plan - 12/03/21 0835     Clinical Impression Statement Patient present at OT evaluation  with dog bite to right hand with multiple puncture wounds on 09/17/21 - pt out 11  wks.   Patient made great progress in scar adhesion as well as tenderness tolerating different textures as well as massage.  Patient with composite fist and full extension of digits - in combination of wrist flexion and extention today pain 3-4/10 at the most. -  pull over radial wrist and forearm and volar wrist  into med flexors. Pt cont to be tender with most pain over volar and dorsal hand at scar. Grip and prehension strength decrease the lsat 2 times asses. THe last 2 session focus more on increase AROM and initiation of strengthening with less pain instead of scar mobs. Pt to cont at home scar massage/desentitization. Patient can benefit from skilled OT services to decrease scar tissue,  desensitization, edema and pain with increased motion and strength to return to prior level of function.    OT Occupational Profile and History Problem Focused Assessment - Including review of records relating to presenting problem    Occupational performance deficits (Please refer to evaluation for details): ADL's;IADL's;Work;Play;Leisure;Social Participation    Body Structure / Function / Physical Skills ADL;Strength;Pain;Dexterity;Edema;UE functional use;IADL;ROM;Scar mobility;Flexibility;FMC    Rehab Potential Good    Clinical Decision Making Limited treatment options, no task modification necessary    Comorbidities Affecting Occupational Performance: None    Modification or Assistance to Complete Evaluation  No modification of tasks or assist necessary to complete eval    OT Frequency 2x / week    OT Duration 8 weeks    OT Treatment/Interventions Self-care/ADL training;Fluidtherapy;Contrast Bath;Therapeutic exercise;Iontophoresis;Paraffin;Manual Therapy;Patient/family education;Passive range of motion;Scar mobilization    Consulted and Agree with Plan of Care Patient             Patient will benefit from skilled therapeutic intervention in order to improve the following deficits  and impairments:   Body Structure / Function / Physical Skills: ADL, Strength, Pain, Dexterity, Edema, UE functional use, IADL, ROM, Scar mobility, Flexibility, [facility]       Visit Diagnosis: Pain in right hand  Stiffness of right hand, not elsewhere classified  Localized edema  Scar tissue    Problem List Patient Active Problem List   Diagnosis Date Noted   Status post vaginal hysterectomy 01/29/2015   HLD (hyperlipidemia) 07/18/2013   Clinical depression 07/18/2013   Female genuine stress incontinence 05/23/2013   Incomplete bladder emptying 05/23/2013   Excessive urination at night 05/23/2013    Rosalyn Gess, OTR/L,CLT 12/03/2021, 9:45 AM  [place]  [facility] [address] [place], [address] Phone: [phone]   Fax:  [phone]  Name: Felicia Cantu MRN: [id number] Date of Birth: 1961/01/20

## 2021-12-07 ENCOUNTER — Ambulatory Visit: Payer: 59 | Admitting: Occupational Therapy

## 2021-12-07 DIAGNOSIS — L905 Scar conditions and fibrosis of skin: Secondary | ICD-10-CM

## 2021-12-07 DIAGNOSIS — R600 Localized edema: Secondary | ICD-10-CM

## 2021-12-07 DIAGNOSIS — M79641 Pain in right hand: Secondary | ICD-10-CM | POA: Diagnosis not present

## 2021-12-07 DIAGNOSIS — M25641 Stiffness of right hand, not elsewhere classified: Secondary | ICD-10-CM

## 2021-12-07 NOTE — Therapy (Signed)
[place] [facility] [address], [place], [address] Phone: [phone]   Fax:  [phone]  Occupational Therapy Treatment  Patient Details  Name: Felicia Cantu MRN: [id number] Date of Birth: 1960/06/27 Referring Provider (OT): [place]   Encounter Date: 12/07/2021   OT End of Session - 12/07/21 0916     Visit Number 16    Number of Visits 24    Date for OT Re-Evaluation 01/04/22    OT Start Time 0817    OT Stop Time 0908    OT Time Calculation (min) 51 min    Activity Tolerance Patient tolerated treatment well    Behavior During Therapy [facility] for tasks assessed/performed             Past Medical History:  Diagnosis Date   Breast tenderness    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   Subjective Assessment - 12/07/21 0914     Subjective  Using it more or trying - did do scar massage and 2 lbs weight- do like the compression glove and soft black splint for support    Pertinent History Pt was bit by dog on 09/17/21 on R hand - xray negative for fracture- multiple wounds- was on antibiotics since seen at walk in clinic same day - seen by Ortho on 09/23/21 and then again on 10/07/21 she was referred to OT for desentitization, scar management - has trigger fingers 2nd thru 4th - with pain and stiffness -    Patient Stated Goals I want the pain, motion and strength in my dominant hand better so 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 Yes    Pain Score 4     Pain Location Hand   wrist/foreram   Pain Orientation Right    Pain Descriptors / Indicators Aching;Tender;Tightness    Pain Onset More than a month ago    Pain Frequency Intermittent                [facility] OT Assessment - 12/07/21 0001       AROM   Right Wrist Extension 70 Degrees    Right Wrist Flexion 90 Degrees      Strength   Right Hand Grip (lbs) 22    Right Hand Lateral Pinch 10 lbs    Right Hand 3 Point Pinch 7 lbs    Left Hand Grip (lbs) 56    Left Hand Lateral Pinch 12 lbs    Left Hand 3 Point Pinch 11 lbs               Had pt push and pull heavy door with much more ease but  still pain 4-5/10 - able to turn key but pain with lat pinch -pt to place key inbetween 2nd and 3rd digits and use forearm Not over grip or use lat pinch - but light grip Use compression glove and CMC neoprene splint  Pt show increase grip in forearm sup /pro and wrist flexion, ext, RD, UD -  Thumb resistance in all planes - some pain with RD  And Opposition to 5th        OT Treatments/Exercises (OP) - 12/07/21 0001       Ultrasound   Ultrasound Location THumb MC and IP    Ultrasound Parameters 3.62mz, 20%, 0.8 intensity      RUE Paraffin   Number Minutes Paraffin 8 Minutes    RUE Paraffin Location Hand    Comments prior to soft tissue massage and ther ext                Patient to continue with scar massage palmar scar improvement patient with using [employer] -Care scar pad under  Isotoner glove fitted.   Pt to cont wearing CMC neoprene to use during day - to see if can decrease stiffness and pain in thumb webspace and thumb thenar eminence      Prior to     table slides prior to  composite extension no pressure 20 reps and then wrist and finger flexion over the armrest with elbow to side palm down and in neutral 5x5 reps Upgrade to 2 lbs  - 2 sets for wrist RD,  UD over armrest  and wrist flexion, extention- pain free -10 reps Pain at side of body standing when attempted still Sup /pro pain when unsupported in thumb thenar/ ulnar hand and med prox forearm flexors this date too But able to do 2 sets of 10 when supported /glove and CMC neoprene  2 x day -with stretches for flexors inbetween   Done kinesiotape to dorsal hand at scar tissue- Pt report less pain when making fist and composite extention Discussed with patient to the difference between feeling a heavy feeling , tired feeling when working out VS pain and discomfort. Appear this date patient feels more like a heavy, tired feeling working out feeling in the forearm and the wrist and pain. Was able to add light blue putty for gripping - 2 x day- 10 reps - pain free             OT Education - 12/07/21 0916     Education Details progress and changes to HEP    Person(s) Educated Patient    Methods Explanation;Demonstration;Tactile cues;Verbal cues;Handout    Comprehension Verbalized understanding;Verbal cues required;Returned demonstration              OT Short Term Goals - 10/14/21 1954       OT SHORT TERM GOAL #1   Title Patient to be independent in home program to decrease hypersensitivity to palmar scar to tolerate using hand in bathing and dressing with pain less than 3/10    Baseline Unable to tolerate soft massage and soft textures more than 20 seconds.  Pain increases to a 5-10/10 with scar massage unable to use towel.  Or use any tools in hand.    Time 3    Period Weeks    Status New    Target Date 11/04/21               OT Long Term Goals - 10/14/21 1955  OT LONG TERM GOAL #1   Title Patient hypersensitivity in the right hand improved for patient to be able to use hand normally to grip tools, utensils with pain less than 2/10    Baseline Pain and hypersensitivity over palmar scar increased from 5-10/10.  Tolerate soft massage as well as soft textures  less than 20 seconds.  Unable to grip any and hand touch and scar.    Time 5    Period Weeks    Status New    Target Date 11/18/21      OT LONG TERM GOAL #2   Title Active range of motion in right hand digits improved to within normal limits for composite flexion as well as full extension with no triggering and able to don and doff gloves and put hand in pocket.    Baseline Patient still triggering of second through fourth with composite fist.  Pain with any active range of motion extension and flexion 5-10/10, MC flexion 82nd digit 93rd through fifth, PIP flexion 90 degrees DIP flexion 30 through 45 degrees.    Time 6    Period Weeks    Status New    Target Date 11/25/21      OT LONG TERM GOAL #3   Title Right wrist active range of motion improved to within normal limits in all planes symptom-free.    Baseline Wrist extension 58 with pain.  Flexion 85 slight pull over dorsal wrist    Time 4    Period Weeks    Status New    Target Date 11/11/21      OT LONG TERM GOAL #4   Title Right hand grip and prehension strength improved to more than 75% compared to left hand to be able to return to prior level of using hand and yard work, crafts and return back to work.    Baseline Unable to use hand in any gripping or prehension pain increases to 10/10, hypersensitivity with scar stiffness in all digits and triggering.  Strength not tested.    Time 8    Period Weeks    Status New    Target Date 12/09/21                   Plan - 12/07/21 1610     Clinical Impression Statement Patient present at OT evaluation  with dog bite to right hand with multiple puncture wounds on 09/17/21 - pt out 11  wks.   Patient made great progress in scar adhesion as well as tenderness tolerating different textures as well as massage.  Patient with composite fist and full extension of digits - in combination of wrist flexion and extention today pain 4/10 at the most. -  pain mostly this date in thumb IP, MC  and radiation into volar wrist - pt has been compensating for so long. Grip cont to decrease but  prehension strength increase - mostly lat pinch -but 3 point did increase but pain at [facility] and IP of thumb.  Cont to focus more on increase AROM and  strengthening with less pain instead of scar mobs. Pt to cont at home scar massage/desentitization. Able to do 2 lbs -and initiate some ligth putty for gripping- reinfoce for pt to not compensate at much with lat and 3 point pinch.  Patient can benefit from skilled OT services to decrease scar tissue, desensitization, edema and pain with increased motion and strength to return to prior level of function.    OT Occupational  Profile and History Problem Focused Assessment - Including review of records relating to presenting problem    Occupational performance deficits (Please refer to evaluation for details): ADL's;IADL's;Work;Play;Leisure;Social Participation    Body Structure / Function / Physical Skills ADL;Strength;Pain;Dexterity;Edema;UE functional use;IADL;ROM;Scar mobility;Flexibility;FMC    Rehab Potential Good    Clinical Decision Making Limited treatment options, no task modification necessary    Comorbidities Affecting Occupational Performance: None    Modification or Assistance to Complete Evaluation  No modification of tasks or assist necessary to complete eval    OT Frequency 2x / week    OT Duration 4 weeks    OT Treatment/Interventions Self-care/ADL training;Fluidtherapy;Contrast Bath;Therapeutic exercise;Iontophoresis;Paraffin;Manual Therapy;Patient/family education;Passive range of motion;Scar mobilization    Consulted and Agree with Plan of Care Patient             Patient will benefit from skilled therapeutic intervention in order to improve the following deficits and impairments:   Body Structure / Function / Physical Skills: ADL, Strength, Pain, Dexterity, Edema, UE functional use, IADL, ROM, Scar mobility, Flexibility, [facility]        Visit Diagnosis: Pain in right hand  Stiffness of right hand, not elsewhere classified  Localized edema  Scar tissue    Problem List Patient Active Problem List   Diagnosis Date Noted   Status post vaginal hysterectomy 01/29/2015   HLD (hyperlipidemia) 07/18/2013   Clinical depression 07/18/2013   Female genuine stress incontinence 05/23/2013   Incomplete bladder emptying 05/23/2013   Excessive urination at night 05/23/2013    Rosalyn Gess, OTR/L,CLT 12/07/2021, 9:21 AM  [place] [facility] [address], [place], [address] Phone: [phone]   Fax:  [phone]  Name: ANIZA SHOR MRN: [id number] Date of Birth: June 07, 1960

## 2021-12-07 NOTE — Addendum Note (Signed)
Addended by: Rosalyn Gess on: 12/07/2021 09:37 AM   Modules accepted: Orders

## 2021-12-10 ENCOUNTER — Ambulatory Visit: Payer: 59 | Attending: Orthopedic Surgery | Admitting: Occupational Therapy

## 2021-12-10 DIAGNOSIS — M25641 Stiffness of right hand, not elsewhere classified: Secondary | ICD-10-CM | POA: Insufficient documentation

## 2021-12-10 DIAGNOSIS — M79641 Pain in right hand: Secondary | ICD-10-CM | POA: Insufficient documentation

## 2021-12-10 DIAGNOSIS — L905 Scar conditions and fibrosis of skin: Secondary | ICD-10-CM | POA: Diagnosis present

## 2021-12-10 DIAGNOSIS — R600 Localized edema: Secondary | ICD-10-CM | POA: Diagnosis present

## 2021-12-10 NOTE — Therapy (Signed)
[place] [facility] [address] [place], [address] Phone: [phone]   Fax:  [phone]  Occupational Therapy Treatment  Patient Details  Name: Felicia Cantu MRN: [id number] Date of Birth: 1960/02/22 Referring Provider (OT): [place]   Encounter Date: 12/10/2021   OT End of Session - 12/10/21 0838     Visit Number 17    Number of Visits 24    Date for OT Re-Evaluation 01/04/22    OT Start Time 0817    OT Stop Time 0902    OT Time Calculation (min) 45 min    Activity Tolerance Patient tolerated treatment well    Behavior During Therapy [facility] for tasks assessed/performed             Past Medical History:  Diagnosis Date   Breast tenderness    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   Subjective Assessment - 12/10/21 0821     Subjective  Done the 2 lbs and one spot of the scar bothering me still and thumb still at times will just ache up in to my forearm    Pertinent History Pt was bit by dog on 09/17/21 on R hand - xray negative for fracture- multiple wounds- was on antibiotics since seen at walk in clinic same day - seen by Ortho on 09/23/21 and then again on 10/07/21 she was referred to OT for desentitization, scar management - has trigger fingers 2nd thru 4th - with pain and stiffness -    Patient Stated Goals I want the pain, motion and strength in my dominant hand better so 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 Yes    Pain Score 4     Pain Location Hand   wrist volar /thumb   Pain Orientation Right    Pain Descriptors / Indicators Aching    Pain Type Acute pain    Pain Onset More than a month ago    Pain Frequency Intermittent                        Ultrasound at the end of the session to decrease pain in thumb MC and CMC into volar wrist.  OT Treatments/Exercises (OP) - 12/10/21 0001       Ultrasound   Ultrasound Location THumb MC and thenar eminence    Ultrasound Parameters 3.72mz, 20%, 0.8 intensity    Ultrasound Goals Pain      RUE Paraffin   Number Minutes Paraffin 8 Minutes    RUE Paraffin Location Hand    Comments prior to scar massage and ROM  Paraffin done prior to scar massage and soft tissue.    Change patient this date to thumb with a wrist wrap to assess if CMC neoprene -if she is fighting the band around [facility] assisting thumb out of palm.         Patient to continue with table slides prior to  composite extension no pressure 20 reps and then wrist and finger flexion over the armrest with elbow to side palm down and in neutral 5x5 reps Continue 2 lbs  - 2 sets for wrist RD, UD over armrest  and wrist flexion, extention- pain free -10 reps Pain at side of body standing when attempted still Sup /pro pain when unsupported in thumb thenar/ ulnar hand and med prox forearm flexors this date too But able to do 2 sets of 10 when supported /glove and CMC neoprene   2 x day -with stretches for flexors inbetween   Focus this date on scar tissue massage using mini massager as well as manual by OT with traction and metacarpal spreads.-Also using extractor and mid and distal scar with  making fist and composite extention  Discussed with patient to the difference between feeling a heavy feeling , tired feeling when working out VS pain and discomfort.  light blue putty for gripping - 2  x day- 10 reps - pain free       OT Education - 12/10/21 0838     Education Details progress and changes to HEP    Person(s) Educated Patient    Methods Explanation;Demonstration;Tactile cues;Verbal cues;Handout    Comprehension Verbalized understanding;Verbal cues required;Returned demonstration              OT Short Term Goals - 10/14/21 1954       OT SHORT TERM GOAL #1   Title Patient to be independent in home program to decrease hypersensitivity to palmar scar to tolerate using hand in bathing and dressing with pain less than 3/10    Baseline Unable to tolerate soft massage and soft textures more than 20 seconds.  Pain increases to a 5-10/10 with scar massage unable to use towel.  Or use any tools in hand.    Time 3    Period Weeks    Status New    Target Date 11/04/21               OT Long Term Goals - 10/14/21 1955       OT LONG TERM GOAL #1   Title Patient hypersensitivity in the right hand improved for patient to be able to use hand normally to grip tools, utensils with pain less than 2/10    Baseline Pain and hypersensitivity over palmar scar increased from 5-10/10.  Tolerate soft massage as well as soft textures less than 20 seconds.  Unable to grip any and hand touch and scar.    Time 5    Period Weeks    Status New    Target Date 11/18/21      OT LONG TERM GOAL #2   Title Active range of motion in right hand digits improved to within normal limits for composite flexion as well as full extension with no triggering and able to don and doff gloves and put hand in pocket.    Baseline Patient still triggering of second through fourth with composite fist.  Pain with any active range of motion extension and flexion 5-10/10, MC flexion 82nd digit 93rd through fifth, PIP flexion 90 degrees DIP flexion 30 through 45  degrees.    Time 6    Period Weeks    Status New    Target Date 11/25/21      OT LONG TERM GOAL #3   Title Right wrist active range of motion  improved to within normal limits in all planes symptom-free.    Baseline Wrist extension 58 with pain.  Flexion 85 slight pull over dorsal wrist    Time 4    Period Weeks    Status New    Target Date 11/11/21      OT LONG TERM GOAL #4   Title Right hand grip and prehension strength improved to more than 75% compared to left hand to be able to return to prior level of using hand and yard work, crafts and return back to work.    Baseline Unable to use hand in any gripping or prehension pain increases to 10/10, hypersensitivity with scar stiffness in all digits and triggering.  Strength not tested.    Time 8    Period Weeks    Status New    Target Date 12/09/21                   Plan - 12/10/21 0839     Clinical Impression Statement Patient present at OT evaluation  with dog bite to right hand with multiple puncture wounds on 09/17/21 - pt out 11 1/2  wks.   Patient made great progress in scar adhesion as well as tenderness tolerating different textures as well as massage.  Patient with composite fist and full extension of digits - in combination of wrist flexion and extention today pain 4/10 at the most. -  pain mostly this date in thumb MC and radiation into volar wrist  and dorsal hand over 4th and 3rd digits- pt has been compensating for so long.  Focus more on increase AROM and  strengthening with less pain the last few session - keeping HEP same but this date focus on scar tissue again - distal end of scar - with ROM. Pt to cont at home scar massage/desentitization. Able to do 2 lbs and ligth putty for gripping- reinfoce for pt to not compensate at much with lat and 3 point pinch.  Patient can benefit from skilled OT services to decrease scar tissue, desensitization, edema and pain with increased motion and strength to return to prior level of function.    OT Occupational Profile and History Problem Focused Assessment - Including review of records relating to presenting problem     Occupational performance deficits (Please refer to evaluation for details): ADL's;IADL's;Work;Play;Leisure;Social Participation    Body Structure / Function / Physical Skills ADL;Strength;Pain;Dexterity;Edema;UE functional use;IADL;ROM;Scar mobility;Flexibility;FMC    Rehab Potential Good    Clinical Decision Making Limited treatment options, no task modification necessary    Comorbidities Affecting Occupational Performance: None    Modification or Assistance to Complete Evaluation  No modification of tasks or assist necessary to complete eval    OT Frequency 2x / week    OT Duration 4 weeks    OT Treatment/Interventions Self-care/ADL training;Fluidtherapy;Contrast Bath;Therapeutic exercise;Iontophoresis;Paraffin;Manual Therapy;Patient/family education;Passive range of motion;Scar mobilization    Consulted and Agree with Plan of Care Patient             Patient will benefit from skilled therapeutic intervention in order to improve the following deficits and impairments:   Body Structure / Function / Physical Skills: ADL, Strength, Pain, Dexterity, Edema, UE functional use, IADL, ROM, Scar mobility, Flexibility, [facility]  Visit Diagnosis: Pain in right hand  Stiffness of right hand, not elsewhere classified  Localized edema  Scar tissue    Problem List Patient Active Problem List   Diagnosis Date Noted   Status post vaginal hysterectomy 01/29/2015   HLD (hyperlipidemia) 07/18/2013   Clinical depression 07/18/2013   Female genuine stress incontinence 05/23/2013   Incomplete bladder emptying 05/23/2013   Excessive urination at night 05/23/2013    Rosalyn Gess, OTR/L,CLT 12/10/2021, 12:36 PM  [place] [facility] [address] [place], [address] Phone: [phone]   Fax:  [phone]  Name: Felicia Cantu MRN: [id number] Date of Birth: October 28, 1960

## 2021-12-14 ENCOUNTER — Ambulatory Visit: Payer: 59 | Admitting: Occupational Therapy

## 2021-12-14 DIAGNOSIS — R600 Localized edema: Secondary | ICD-10-CM

## 2021-12-14 DIAGNOSIS — L905 Scar conditions and fibrosis of skin: Secondary | ICD-10-CM

## 2021-12-14 DIAGNOSIS — M79641 Pain in right hand: Secondary | ICD-10-CM

## 2021-12-14 DIAGNOSIS — M25641 Stiffness of right hand, not elsewhere classified: Secondary | ICD-10-CM

## 2021-12-14 NOTE — Therapy (Signed)
[place] [address], [place], [address] Phone: [phone]   Fax:  [phone]  Occupational Therapy Treatment  Patient Details  Name: Felicia Cantu MRN: [id number] Date of Birth: 21-Dec-1960 Referring Provider (OT): [facility] [place]   Encounter Date: 12/14/2021   OT End of Session - 12/14/21 1453     Visit Number 18    Number of Visits 24    Date for OT Re-Evaluation 01/04/22    OT Start Time 0819    OT Stop Time 0901    OT Time Calculation (min) 42 min    Activity Tolerance Patient tolerated treatment well    Behavior During Therapy [facility] for tasks assessed/performed             Past Medical History:  Diagnosis Date   Breast tenderness    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   Subjective Assessment - 12/14/21 1451     Subjective  Was hurting for few days after you worked last time the scar - still some pull and pain in thumb ,wrist and forearm and tight feeling when making fist - back of ring and middle fingers    Pertinent History Pt was bit by dog on 09/17/21 on R hand - xray negative for fracture- multiple wounds- was on antibiotics since seen at walk in clinic same day - seen by Ortho on 09/23/21 and then again on 10/07/21 she was referred to OT for desentitization, scar management - has trigger fingers 2nd thru 4th - with pain and stiffness -    Patient Stated Goals I want the  pain, motion and strength in my dominant hand better 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 Yes    Pain Score 4     Pain Location --   hand, ulnar wrist and thumb   Pain Orientation Right    Pain Descriptors / Indicators Aching;Tightness    Pain Type Acute pain    Pain Onset More than a month ago    Pain Frequency Intermittent                          OT Treatments/Exercises (OP) - 12/14/21 0001       RUE Fluidotherapy   Number Minutes Fluidotherapy 8 Minutes    RUE Fluidotherapy Location Wrist;Hand    Comments 2 cycle of ice 1 min - decrease stiffness and edema            Patient to continue with scar massage palmar scar improvement patient with using Cica -Care scar pad under  Isotoner glove fitted.  Prior to Med N glide this date done      table slides with increase wrist extention  5 reps - 2 x day - slight pull over volar thumb and thenar eminence Cont  wrist and finger flexion over the armrest with elbow to side palm down and in neutral 5x5 reps Cont with 2 lbs  - 2 sets for wrist RD, UD over armrest  and wrist flexion, extention- pain free -10 reps  Sup /pro pain when unsupported in thumb thenar/ ulnar hand and med prox forearm flexors this date too But able to do 2 sets of 10 when supported /glove and CMC neoprene   2 x day -with stretches for flexors inbetween   Discussed with patient to the difference between feeling a heavy feeling , tired feeling when working out VS pain and discomfort. cont light blue putty for gripping - 2 x day- 10 reps - pain free  Reassess grip, prehension -strength - ROM - pain levels               OT Education - 12/14/21 1453     Education Details progress and changes to HEP    Person(s) Educated Patient    Methods Explanation;Demonstration;Tactile cues;Verbal cues;Handout    Comprehension Verbalized understanding;Verbal cues required;Returned demonstration               OT Short Term Goals - 10/14/21 1954       OT SHORT TERM GOAL #1   Title Patient to be independent in home program to decrease hypersensitivity to palmar scar to tolerate using hand in bathing and dressing with pain less than 3/10    Baseline Unable to tolerate soft massage and soft textures more than 20 seconds.  Pain increases to a 5-10/10 with scar massage unable to use towel.  Or use any tools in hand.    Time 3    Period Weeks    Status New    Target Date 11/04/21               OT Long Term Goals - 10/14/21 1955       OT LONG TERM GOAL #1   Title Patient hypersensitivity in the right hand improved for patient to be able to use hand normally to grip tools, utensils with pain less than 2/10    Baseline Pain and hypersensitivity over palmar scar increased from 5-10/10.  Tolerate soft massage as well as soft textures less than 20 seconds.  Unable to grip any and hand touch and scar.    Time 5    Period Weeks    Status New    Target Date 11/18/21      OT LONG TERM GOAL #2   Title Active range of motion in right hand digits improved to within normal limits for composite flexion as well as full extension with no triggering and able to don and doff gloves and put hand in pocket.    Baseline Patient still triggering of second through fourth with composite fist.  Pain with any active range of motion extension and flexion 5-10/10, MC flexion 82nd digit 93rd through fifth, PIP flexion 90 degrees DIP flexion 30 through 45 degrees.    Time 6    Period Weeks    Status New    Target Date 11/25/21      OT LONG TERM GOAL #3   Title Right wrist active range of motion improved to within normal limits in all planes symptom-free.  Baseline Wrist extension 58 with pain.  Flexion 85 slight pull over dorsal wrist    Time 4    Period Weeks    Status New    Target Date 11/11/21      OT LONG TERM GOAL #4   Title Right hand grip and prehension strength improved to more than  75% compared to left hand to be able to return to prior level of using hand and yard work, crafts and return back to work.    Baseline Unable to use hand in any gripping or prehension pain increases to 10/10, hypersensitivity with scar stiffness in all digits and triggering.  Strength not tested.    Time 8    Period Weeks    Status New    Target Date 12/09/21                   Plan - 12/14/21 1454     Clinical Impression Statement Patient present at OT evaluation  with dog bite to right hand with multiple puncture wounds on 09/17/21 - pt out 12  wks.   Patient made great progress in scar adhesion as well as tenderness tolerating different textures as well as massage.  Patient with composite fist and full extension of digits - in combination of wrist flexion and extention today pain 4/10 at the most. -  pain mostly this date in thumb MC and radiation into volar wrist  and dorsal hand over 4th and 3rd digits- pt has been compensating for so long.  Focus more on increase AROM and  strengthening with less pain the last few session - keeping HEP same but this date focus contrast to decrease edema over hand and add Med N glide 5 reps. Pt to cont at home scar massage/desentitization. Able to do 2 lbs and ligth putty for gripping- reinfoce for pt to not compensate at much with lat and 3 point pinch.  Patient can benefit from skilled OT services to decrease scar tissue, desensitization, edema and pain with increased motion and strength to return to prior level of function.    OT Occupational Profile and History Problem Focused Assessment - Including review of records relating to presenting problem    Occupational performance deficits (Please refer to evaluation for details): ADL's;IADL's;Work;Play;Leisure;Social Participation    Body Structure / Function / Physical Skills ADL;Strength;Pain;Dexterity;Edema;UE functional use;IADL;ROM;Scar mobility;Flexibility;FMC    Rehab Potential Good    Clinical  Decision Making Limited treatment options, no task modification necessary    Comorbidities Affecting Occupational Performance: None    Modification or Assistance to Complete Evaluation  No modification of tasks or assist necessary to complete eval    OT Frequency 2x / week    OT Duration 4 weeks    OT Treatment/Interventions Self-care/ADL training;Fluidtherapy;Contrast Bath;Therapeutic exercise;Iontophoresis;Paraffin;Manual Therapy;Patient/family education;Passive range of motion;Scar mobilization    Consulted and Agree with Plan of Care Patient             Patient will benefit from skilled therapeutic intervention in order to improve the following deficits and impairments:   Body Structure / Function / Physical Skills: ADL, Strength, Pain, Dexterity, Edema, UE functional use, IADL, ROM, Scar mobility, Flexibility, [facility]       Visit Diagnosis: Pain in right hand  Stiffness of right hand, not elsewhere classified  Localized edema  Scar tissue    Problem List Patient Active Problem List   Diagnosis Date Noted   Status post vaginal hysterectomy 01/29/2015   HLD (hyperlipidemia) 07/18/2013  Clinical depression 07/18/2013   Female genuine stress incontinence 05/23/2013   Incomplete bladder emptying 05/23/2013   Excessive urination at night 05/23/2013    Rosalyn Gess, OTR/L,CLT 12/14/2021, 2:57 PM  [place] [facility] [address] [place], [address] Phone: [phone]   Fax:  [phone]  Name: Felicia Cantu MRN: [id number] Date of Birth: [birth year]

## 2021-12-17 ENCOUNTER — Ambulatory Visit: Payer: 59 | Admitting: Occupational Therapy

## 2021-12-17 DIAGNOSIS — M79641 Pain in right hand: Secondary | ICD-10-CM | POA: Diagnosis not present

## 2021-12-17 DIAGNOSIS — L905 Scar conditions and fibrosis of skin: Secondary | ICD-10-CM

## 2021-12-17 DIAGNOSIS — R600 Localized edema: Secondary | ICD-10-CM

## 2021-12-18 ENCOUNTER — Encounter: Payer: Self-pay | Admitting: Occupational Therapy

## 2021-12-18 NOTE — Therapy (Signed)
[place] [facility] [address] [place], [address] Phone: [phone]   Fax:  [phone]  Occupational Therapy Treatment  Patient Details  Name: [name] MRN: [id number] Date of Birth: March 08, 1960 Referring Provider (Felicia Cantu): [facility] [place]   Encounter Date: 12/17/2021   Felicia Cantu End of Session - 12/18/21 0537     Visit Number 19    Number of Visits 24    Date for Felicia Cantu Re-Evaluation 01/04/22    Felicia Cantu Start Time 0815    Felicia Cantu Stop Time 0900    Felicia Cantu Time Calculation (min) 45 min    Activity Tolerance Patient tolerated treatment well    Behavior During Therapy [facility] for tasks assessed/performed             Past Medical History:  Diagnosis Date   Breast tenderness    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   Subjective Assessment - 12/18/21 0536     Subjective  Pt reports she is doing well, reports she has improved over time since starting therapy but still has pain in hand and forearm, 4/10    Pertinent History Pt was bit by dog on 09/17/21 on R hand - xray negative for fracture- multiple wounds- was on antibiotics since seen at walk in clinic same day - seen by Ortho on 09/23/21 and then again on 10/07/21 she was referred to Felicia Cantu for desentitization, scar management - has trigger fingers 2nd thru 4th - with pain and stiffness -    Patient Stated Goals I want the pain, motion and strength in my dominant hand better  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 Yes    Pain Score 4     Pain Location Hand    Pain Orientation Right    Pain Descriptors / Indicators Tightness;Aching    Pain Type Acute pain    Pain Onset More than a month ago    Pain Frequency Intermittent            Pt with 4/10 pain in right hand in palmar area, dorsal area and reports pain radiates at times to forearm.    Fluidotherapy to right wrist and hand as outlined in flowsheet.  Following fluidotherapy, pt was seen for manual therapy for soft tissue massage to hand, scar massage techniques to palmar surface to decrease adhesions and improve tissue mobility and tightness, decrease pain.  Pt continues to use Felicia Cantu at night . Use of scar extractor this date to palmar scar area to decrease adhesions.    Therapeutic Exercises: Pt performing table slides with increase wrist extension  5 reps - 2 x day - slight pull over volar  thumb and thenar eminence Cont  wrist and finger flexion over the armrest with elbow to side palm down and in neutral 5x5 reps Cont with 2 lbs  - 2 sets for wrist RD, UD over armrest  and wrist flexion, extension- pain free -10 reps   Sup /pro pain when unsupported in thumb thenar/ ulnar hand and med prox forearm flexors, 2 sets of 10 with supported Felicia Cantu   2 x day -with stretches for flexors in between   Light blue putty for gripping, 10 reps to perform 2 times a day.    Reassessment of strength, grip, pinch, goals and functional use next session for Progress Update     Felicia Cantu Treatments/Exercises (OP) - 12/17/21 0900       RUE Fluidotherapy   Number Minutes Fluidotherapy 8 Minutes    RUE Fluidotherapy Location Wrist;Hand              Felicia Cantu Education - 12/18/21 0537     Education Details progress and changes to HEP    Person(s) Educated Patient    Methods Explanation;Demonstration;Tactile cues;Verbal cues;Handout    Comprehension  Verbalized understanding;Verbal cues required;Returned demonstration              Felicia Cantu Short Term Goals - 10/14/21 1954       Felicia Cantu SHORT TERM GOAL #1   Title Patient to be independent in home program to decrease hypersensitivity to palmar scar to tolerate using hand in bathing and dressing with pain less than 3/10    Baseline Unable to tolerate soft massage and soft textures more than 20 seconds.  Pain increases to a 5-10/10 with scar massage unable to use towel.  Or use any tools in hand.    Time 3    Period Weeks    Status New    Target Date 11/04/21               Felicia Cantu Long Term Goals - 10/14/21 1955       Felicia Cantu LONG TERM GOAL #1   Title Patient hypersensitivity in the right hand improved for patient to be able to use hand normally to grip tools, utensils with pain less than 2/10    Baseline Pain and hypersensitivity over palmar scar increased from 5-10/10.  Tolerate soft massage as well as soft textures less than 20 seconds.  Unable to grip any and hand touch and scar.    Time 5    Period Weeks    Status New    Target Date 11/18/21      Felicia Cantu LONG TERM GOAL #2   Title Active range of motion in right hand digits improved to within normal limits for composite flexion as well as full extension with no triggering and able to don and doff gloves and put hand in pocket.    Baseline Patient still triggering of second through fourth with composite fist.  Pain with any active range of motion extension and flexion 5-10/10, MC flexion 82nd digit 93rd through fifth, PIP flexion 90 degrees DIP flexion 30 through 45 degrees.    Time 6    Period Weeks    Status New    Target Date 11/25/21      Felicia Cantu LONG TERM GOAL #3   Title Right wrist active range of motion improved to within normal limits in all planes symptom-free.    Baseline Wrist extension 58 with pain.  Flexion 85 slight pull over dorsal wrist    Time 4  Period Weeks    Status New    Target Date 11/11/21      Felicia Cantu LONG TERM GOAL #4    Title Right hand grip and prehension strength improved to more than 75% compared to left hand to be able to return to prior level of using hand and yard work, crafts and return back to work.    Baseline Unable to use hand in any gripping or prehension pain increases to 10/10, hypersensitivity with scar stiffness in all digits and triggering.  Strength not tested.    Time 8    Period Weeks    Status New    Target Date 12/09/21                   Plan - 12/18/21 0537     Clinical Impression Statement Patient present at Felicia Cantu evaluation  with dog bite to right hand with multiple puncture wounds on 09/17/21 - pt out 12  wks.   Patient made great progress in scar adhesion as well as tenderness tolerating different textures as well as massage.  Patient with composite fist and full extension of digits - in combination of wrist flexion and extention today pain 4/10 at the most. -  pain mostly this date in thumb MC and radiation into volar wrist  and dorsal hand over 4th and 3rd digits- pt has been compensating for so long.  Pt to cont at home scar massage/desentitization. Able to perform 2 lbs and light blue putty for gripping- reinforce for pt to not compensate at much with lat and 3 point pinch.   Continued progress with right hand, remains limited by pain at times which can be disperse along hand and forearm.  Pt able to tolerated scar massage and desensitization techniques as well as scar extractor this date with some tenderness with pin point pain in palm. Continue to work towards decreasing pain, improving ROM and functional use of right UE for daily tasks.   Patient can benefit from skilled Felicia Cantu services to decrease scar tissue, desensitization, edema and pain with increased motion and strength to return to prior level of function.    Felicia Cantu Occupational Profile and History Problem Focused Assessment - Including review of records relating to presenting problem    Occupational performance deficits  (Please refer to evaluation for details): ADL's;IADL's;Work;Play;Leisure;Social Participation    Body Structure / Function / Physical Skills ADL;Strength;Pain;Dexterity;Edema;UE functional use;IADL;ROM;Scar mobility;Flexibility;FMC    Rehab Potential Good    Clinical Decision Making Limited treatment options, no task modification necessary    Comorbidities Affecting Occupational Performance: None    Modification or Assistance to Complete Evaluation  No modification of tasks or assist necessary to complete eval    Felicia Cantu Frequency 2x / week    Felicia Cantu Duration 4 weeks    Felicia Cantu Treatment/Interventions Self-Cantu/ADL training;Fluidtherapy;Contrast Bath;Therapeutic exercise;Iontophoresis;Paraffin;Manual Therapy;Patient/family education;Passive range of motion;Scar mobilization    Consulted and Agree with Plan of Cantu Patient             Patient will benefit from skilled therapeutic intervention in order to improve the following deficits and impairments:   Body Structure / Function / Physical Skills: ADL, Strength, Pain, Dexterity, Edema, UE functional use, IADL, ROM, Scar mobility, Flexibility, [facility]       Visit Diagnosis: Pain in right hand  Localized edema  Scar tissue    Problem List Patient Active Problem List   Diagnosis Date Noted   Status post vaginal hysterectomy 01/29/2015   HLD (hyperlipidemia) 07/18/2013  Clinical depression 07/18/2013   Female genuine stress incontinence 05/23/2013   Incomplete bladder emptying 05/23/2013   Excessive urination at night 05/23/2013   Felicia Cantu, Felicia Cantu, Felicia Cantu  Felicia Cantu, Felicia Cantu 12/18/2021, 5:59 AM  [facility] [address], [place], [address] Phone: [phone]   Fax:  [phone]  Name: Felicia Cantu MRN: [id number] Date of Birth: [birth year]

## 2021-12-21 ENCOUNTER — Ambulatory Visit: Payer: 59 | Admitting: Occupational Therapy

## 2021-12-21 DIAGNOSIS — L905 Scar conditions and fibrosis of skin: Secondary | ICD-10-CM

## 2021-12-21 DIAGNOSIS — M79641 Pain in right hand: Secondary | ICD-10-CM

## 2021-12-21 DIAGNOSIS — R600 Localized edema: Secondary | ICD-10-CM

## 2021-12-21 DIAGNOSIS — M25641 Stiffness of right hand, not elsewhere classified: Secondary | ICD-10-CM

## 2021-12-21 NOTE — Therapy (Signed)
[facility] [address] [place], [address] Phone: [phone]   Fax:  [phone]  Occupational Therapy Treatment/20th visit   Patient Details  Name: ANESSIA OAKLAND MRN: [id number] Date of Birth: 03-31-1960 Referring Provider (OT): [place]   Encounter Date: 12/21/2021   OT End of Session - 12/21/21 1110     Visit Number 20    Number of Visits 24    Date for OT Re-Evaluation 01/04/22    OT Start Time 0950    OT Stop Time 1041    OT Time Calculation (min) 51 min    Activity Tolerance Patient tolerated treatment well    Behavior During Therapy [facility] for tasks assessed/performed             Past Medical History:  Diagnosis Date   Breast tenderness    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 Yes    Pain Score 4     Pain Location --    dorsal hand, volar wrist and forearm, radial side   Pain Orientation Right    Pain Descriptors / Indicators Tightness;Aching;Tender    Pain Type Acute pain    Pain Onset More than a month ago    Pain Frequency Intermittent                [facility] OT Assessment - 12/21/21 0001       Strength   Right Hand Grip (lbs) 30    Right Hand Lateral Pinch 8 lbs    Right Hand 3 Point Pinch 7 lbs    Left Hand Grip (lbs) 56    Left Hand Lateral Pinch 12 lbs    Left Hand 3 Point Pinch 11 lbs              AROM in digits and wrist WFL -but edema in hand over MC's and digits - limiting composite fist and gripping  With increase pain in hand and volar wrist /forearm- and over thumb  Grip and prehension increasing - see flowsheet  ?  Anti-inflammatory or steroid to  decrease edema and hopefully decrease pain Patient to see orthopedics this Wednesday.  Patient doing so much better and hypersensitivity as well as tenderness over scar but limited edema and pain-limiting [place] in progress of strengthening.      OT Treatments/Exercises (OP) - 12/21/21 0001       RUE Fluidotherapy   Number Minutes Fluidotherapy 8 Minutes    RUE Fluidotherapy Location Wrist;Hand    Comments To ice rotations of 1 minute to decrease swelling             Pt with 4/10 pain in right hand in palmar area, dorsal area and reports pain radiates volar forearm.  As well as thenar eminence and thumb MC   Fluidotherapy to right wrist and hand with 2 rotations of ice to decrease edema prior to motion   Following fluidotherapy, pt was seen for manual therapy for soft tissue massage to hand, scar massage techniques to palmar surface to decrease adhesions and improve tissue mobility and tightness, decrease pain.  Pt continues to use CICA care at night . Use of scar extractor this date to palmar scar area to decrease adhesions at distal and as well as increased discomfort from distal to proximal. Extractor used a mini  massager in combination with motion.  Therapeutic Exercises: Pt performing table slides with increase wrist extension  5 reps - 2 x day - slight pull over volar thumb and thenar eminence Cont  wrist and finger flexion over the armrest with elbow to side palm down and in neutral 5x5 reps Cont with 2 lbs  - 2 sets for wrist RD, UD over armrest  and wrist flexion, extension- pain free -10 reps   Sup /pro pain when unsupported in thumb thenar/ ulnar hand and med prox forearm flexors, 2 sets of 10 with supported Marcy Panning and CMC neoprene   2 x day -with stretches for flexors in between   Light blue putty for gripping, 10 reps to perform 2 times a day.               OT Education - 12/21/21 1110     Education Details progress and changes to HEP    Person(s) Educated Patient    Methods Explanation;Demonstration;Tactile cues;Verbal cues;Handout    Comprehension Verbalized understanding;Verbal cues required;Returned demonstration              OT Short Term Goals - 12/21/21 1118       OT SHORT TERM GOAL #1   Title Patient to be independent in home program to decrease hypersensitivity to palmar scar to tolerate using hand in bathing and dressing with pain less than 3/10    Baseline Unable to tolerate soft massage and soft textures more than 20 seconds.  Pain increases to a 5-10/10 with scar massage unable to use towel.  Or use any tools in hand. - NOW using but pain still increase 4/10    Time 3    Period Weeks    Status On-going    Target Date 01/04/22               OT Long Term Goals - 12/21/21 1119       OT LONG TERM GOAL #1   Title Patient hypersensitivity in the right hand improved for patient to be able to use hand normally to grip tools, utensils with pain less than 2/10    Baseline Pain and hypersensitivity over palmar scar increased from 5-10/10.  Tolerate soft massage as well as soft textures  less than 20 seconds.  Unable to grip any and hand touch and scar. NOW  pain still 4/10 - and if push or hit scar increase 10/10    Time 3    Period Weeks    Status On-going    Target Date 01/04/22      OT LONG TERM GOAL #2   Title Active range of motion in right hand digits improved to within normal limits for composite flexion as well as full extension with no triggering and able to don and doff gloves and put hand in pocket.    Status Achieved      OT LONG TERM GOAL #3   Title Right wrist active range of motion improved to within normal limits in all planes symptom-free.    Baseline Wrist extension 58 with pain.  Flexion 85 slight pull over dorsal wrist NOW AROM WNL but still pull at end range for wrist flexion ,ext increase iwth resistance or weight 4/10    Time 2    Period Weeks    Status On-going    Target Date 01/04/22      OT LONG TERM GOAL #4   Title Right hand grip and prehension strength improved to more than 75% compared to left hand to be able to return to prior level of using hand and yard work, crafts and return back to work.    Baseline Unable to use hand in any gripping or prehension pain increases to 10/10, hypersensitivity with scar stiffness in all digits and triggering.  Strength not tested.  NOW grip increasing and prehension -but pain with grip and 3 point - more than 4/10    Time 2    Period Weeks    Status On-going    Target Date 01/04/22                   Plan - 12/21/21 1111     Clinical Impression Statement Patient present at OT evaluation  with dog bite to right hand with multiple puncture wounds on 09/17/21 - pt out 13  wks.   Patient seen today for 20th visit - shemade great progress in scar adhesion as well as tenderness tolerating different textures as well as massage. Scar still tender at distal edge and mobs distal to proximal. Patient with composite fist and full extension of digits - in combination of wrist flexion and extention - pain 4/10 with end range - and using 2 lbs weight. Pain mostly thumb MC/thenar and  radiating into volar wrist/forearm  and dorsal hand over 4th and 3rd digits- pt has been compensating for so long.  Pt to cont at home scar massage/desentitization. Able to perform 2 lbs and light blue putty for gripping with pain in wrist and forearm- reinforce for pt to not compensate at much with lat and 3 point pinch.   Continued progress with right hand, remains limited by  edema in hand and  pain  which can be disperse along hand and forearm.  Continue to work towards decreasing edema pain, improving ROM and functional use of right UE for daily tasks.   Patient can benefit from skilled OT services to decrease scar tissue, desensitization, edema and pain with increased motion and strength to return to prior level of function. Pt to see Ortho on WEd- ? benefit of anti inflammatory or steriod to help with edema - hopely decreasing tightness/pain in hand, wrist and forearm.    OT Occupational Profile and History Problem Focused Assessment -  Including review of records relating to presenting problem    Occupational performance deficits (Please refer to evaluation for details): ADL's;IADL's;Work;Play;Leisure;Social Participation    Body Structure / Function / Physical Skills ADL;Strength;Pain;Dexterity;Edema;UE functional use;IADL;ROM;Scar mobility;Flexibility;FMC    Rehab Potential Good    Clinical Decision Making Limited treatment options, no task modification necessary    Comorbidities Affecting Occupational Performance: None    Modification or Assistance to Complete Evaluation  No modification of tasks or assist necessary to complete eval    OT Frequency 2x / week    OT Duration 4 weeks    OT Treatment/Interventions Self-care/ADL training;Fluidtherapy;Contrast Bath;Therapeutic exercise;Iontophoresis;Paraffin;Manual Therapy;Patient/family education;Passive range of motion;Scar mobilization    Consulted and Agree with Plan of Care Patient             Patient will benefit from skilled therapeutic  intervention in order to improve the following deficits and impairments:   Body Structure / Function / Physical Skills: ADL, Strength, Pain, Dexterity, Edema, UE functional use, IADL, ROM, Scar mobility, Flexibility, [facility]       Visit Diagnosis: Pain in right hand  Localized edema  Scar tissue  Stiffness of right hand, not elsewhere classified    Problem List Patient Active Problem List   Diagnosis Date Noted   Status post vaginal hysterectomy 01/29/2015   HLD (hyperlipidemia) 07/18/2013   Clinical depression 07/18/2013   Female genuine stress incontinence 05/23/2013   Incomplete bladder emptying 05/23/2013   Excessive urination at night 05/23/2013    Rosalyn Gess, OTR/L,CLT 12/21/2021, 11:21 AM  [facility] [address], [place], [address] Phone: [phone]   Fax:  [phone]  Name: DARILYN STORBECK MRN: [id number] Date of Birth: March 27, 1960

## 2021-12-23 DIAGNOSIS — Z6830 Body mass index (BMI) 30.0-30.9, adult: Secondary | ICD-10-CM | POA: Diagnosis not present

## 2021-12-23 DIAGNOSIS — W540XXA Bitten by dog, initial encounter: Secondary | ICD-10-CM | POA: Diagnosis not present

## 2021-12-23 DIAGNOSIS — S61252D Open bite of right middle finger without damage to nail, subsequent encounter: Secondary | ICD-10-CM | POA: Diagnosis not present

## 2021-12-23 DIAGNOSIS — E119 Type 2 diabetes mellitus without complications: Secondary | ICD-10-CM | POA: Diagnosis not present

## 2021-12-24 ENCOUNTER — Ambulatory Visit: Payer: 59 | Admitting: Occupational Therapy

## 2021-12-24 DIAGNOSIS — L905 Scar conditions and fibrosis of skin: Secondary | ICD-10-CM

## 2021-12-24 DIAGNOSIS — R600 Localized edema: Secondary | ICD-10-CM

## 2021-12-24 DIAGNOSIS — M79641 Pain in right hand: Secondary | ICD-10-CM

## 2021-12-24 DIAGNOSIS — M25641 Stiffness of right hand, not elsewhere classified: Secondary | ICD-10-CM

## 2021-12-24 NOTE — Therapy (Signed)
[facility] [address] [place], [address] Phone: [phone]   Fax:  [phone]  Occupational Therapy Treatment  Patient Details  Name: Felicia Cantu MRN: [id number] Date of Birth: [birth year] Referring Provider (OT): [place]   Encounter Date: 12/24/2021   OT End of Session - 12/24/21 0917     Visit Number 21    Number of Visits 24    Date for OT Re-Evaluation 01/04/22    OT Start Time 0904    OT Stop Time 0948    OT Time Calculation (min) 44 min    Activity Tolerance Patient tolerated treatment well    Behavior During Therapy [facility] for tasks assessed/performed             Past Medical History:  Diagnosis Date   Breast tenderness    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   Subjective Assessment - 12/24/21 0908     Subjective  Seen Dr and put me on anti inflamatory- started taking it last night- still swelling in hand and tight making fist -and still pull over the hand with making fist  and into the wrist and forearm    Pertinent History Pt was bit by dog on 09/17/21 on R hand - xray negative for fracture- multiple wounds- was on antibiotics since seen at walk in clinic same day - seen by Ortho on 09/23/21 and then again on 10/07/21 she was referred to OT for desentitization, scar management - has trigger fingers 2nd thru 4th - with pain and stiffness -    Patient Stated Goals I  want the pain, motion and strength in my dominant hand better 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 Yes    Pain Score 4     Pain Location --   dorsal digits, wrist and forearm volar   Pain Orientation Right    Pain Descriptors / Indicators Tightness;Aching    Pain Type Acute pain                 AROM in digits and wrist WFL -edema  cont to be in hand over MC's and digits - limiting composite fist and gripping- pt started Mobic yesterday  has some pain in  dorsal hand and volar wrist /forearm with first and wrist flexion - some discomfort into  thumb    Patient doing so much better and hypersensitivity as well as tenderness over scar but limited edema and pain-limiting [place] in progress of strengthening.                    OT Treatments/Exercises (OP) - 12/24/21 0001  RUE Fluidotherapy   Number Minutes Fluidotherapy 8 Minutes    RUE Fluidotherapy Location Wrist;Hand    Comments 2 cycles of ice 1 min             Pt with 4/10 pain in right hand in palmar area, dorsal area and reports pain radiates volar forearm.  As well as thenar eminence and thumb MC   Fluidotherapy to right wrist and hand with 2 rotations of ice to decrease edema prior to motion   Following fluidotherapy, pt was seen for manual therapy for soft tissue massage to hand, scar massage techniques to palmar surface to decrease adhesions and improve tissue mobility and tightness, decrease pain.  Pt continues to use CICA care at night . Use of scar extractor to  distal end of palmar scar area to decrease adhesions as well as increased discomfort from distal to proximal. Used mini massager  also on distal end of scar in combination with motion.   Therapeutic Exercises: Pt performing table slides with increase wrist extension  5 reps - 2 x day - slight pull over volar thumb and thenar eminence Cont  wrist and finger flexion over the armrest with elbow to  side palm down and in neutral 5x5 reps Cont with 2 lbs  - 2 sets for wrist RD, UD over armrest  and sup/pro- pull on ulnar side of wrist and forearm Pt to do 1 lbs for wrist flexion, extension- if increase pain more than 4/10     2 x day -with stretches for flexors - table slides prior to 2 lbs weight and after scar massage    Cont Light blue putty for gripping, 10 reps to perform 2 times a day.   Will assess if can increase to teal putty          OT Education - 12/24/21 0917     Education Details progress and changes to HEP    Person(s) Educated Patient    Methods Explanation;Demonstration;Tactile cues;Verbal cues;Handout    Comprehension Verbalized understanding;Verbal cues required;Returned demonstration              OT Short Term Goals - 12/21/21 1118       OT SHORT TERM GOAL #1   Title Patient to be independent in home program to decrease hypersensitivity to palmar scar to tolerate using hand in bathing and dressing with pain less than 3/10    Baseline Unable to tolerate soft massage and soft textures more than 20 seconds.  Pain increases to a 5-10/10 with scar massage unable to use towel.  Or use any tools in hand. - NOW using but pain still increase 4/10    Time 3    Period Weeks    Status On-going    Target Date 01/04/22               OT Long Term Goals - 12/21/21 1119       OT LONG TERM GOAL #1   Title Patient hypersensitivity in the right hand improved for patient to be able to use hand normally to grip tools, utensils with pain less than 2/10    Baseline Pain and hypersensitivity over palmar scar increased from 5-10/10.  Tolerate soft massage as well as soft textures less than 20 seconds.  Unable to grip any and hand touch and scar. NOW pain still 4/10 - and if push or hit scar increase 10/10    Time 3    Period Weeks    Status On-going  Target Date 01/04/22      OT LONG TERM GOAL #2   Title Active range of motion in right hand digits improved to  within normal limits for composite flexion as well as full extension with no triggering and able to don and doff gloves and put hand in pocket.    Status Achieved      OT LONG TERM GOAL #3   Title Right wrist active range of motion improved to within normal limits in all planes symptom-free.    Baseline Wrist extension 58 with pain.  Flexion 85 slight pull over dorsal wrist NOW AROM WNL but still pull at end range for wrist flexion ,ext increase iwth resistance or weight 4/10    Time 2    Period Weeks    Status On-going    Target Date 01/04/22      OT LONG TERM GOAL #4   Title Right hand grip and prehension strength improved to more than 75% compared to left hand to be able to return to prior level of using hand and yard work, crafts and return back to work.    Baseline Unable to use hand in any gripping or prehension pain increases to 10/10, hypersensitivity with scar stiffness in all digits and triggering.  Strength not tested.  NOW grip increasing and prehension -but pain with grip and 3 point - more than 4/10    Time 2    Period Weeks    Status On-going    Target Date 01/04/22                   Plan - 12/24/21 0917     Clinical Impression Statement Patient present at OT evaluation  with dog bite to right hand with multiple puncture wounds on 09/17/21 - pt out 13 1/2  wks. Pt seen Ortho since last time and put on Mobic  - she made great progress from [facility]  in scar adhesion as well as tenderness tolerating different textures as well as massage. Scar still tender at distal edge and mobs distal to proximal. Patient with composite fist and full extension of digits - in combination of wrist flexion and extention - pain 4/10 with end range - and using 2 lbs weight. Pain mostly thumb MC/thenar and radiating into volar wrist/forearm  and dorsal hand over 4th and 3rd digits- pt has been compensating for so long.  Pt to cont at home scar massage/desentitization. Able to perform 2 lbs and light  blue putty for gripping with pain in wrist and forearm- reinforce for pt to not compensate at much with lat and 3 point pinch.   Continued progress with right hand, remains limited by  edema in hand and  pain  which can be disperse along hand and forearm.  Continue to work towards decreasing edema, pain, improving ROM and functional use of right UE for daily tasks.   Patient can benefit from skilled OT services to decrease scar tissue, desensitization, edema and pain with increased motion and strength to return to prior level of function. Pt to see Ortho on WEd- ? benefit of anti inflammatory or steriod to help with edema - hopely decreasing tightness/pain in hand, wrist and forearm.    OT Occupational Profile and History Problem Focused Assessment - Including review of records relating to presenting problem    Occupational performance deficits (Please refer to evaluation for details): ADL's;IADL's;Work;Play;Leisure;Social Participation    Body Structure / Function / Physical Skills ADL;Strength;Pain;Dexterity;Edema;UE functional use;IADL;ROM;Scar mobility;Flexibility;FMC  Rehab Potential Good    Clinical Decision Making Limited treatment options, no task modification necessary    Comorbidities Affecting Occupational Performance: None    Modification or Assistance to Complete Evaluation  No modification of tasks or assist necessary to complete eval    OT Frequency 2x / week    OT Duration 4 weeks    OT Treatment/Interventions Self-care/ADL training;Fluidtherapy;Contrast Bath;Therapeutic exercise;Iontophoresis;Paraffin;Manual Therapy;Patient/family education;Passive range of motion;Scar mobilization    Consulted and Agree with Plan of Care Patient             Patient will benefit from skilled therapeutic intervention in order to improve the following deficits and impairments:   Body Structure / Function / Physical Skills: ADL, Strength, Pain, Dexterity, Edema, UE functional use, IADL, ROM, Scar  mobility, Flexibility, [facility]       Visit Diagnosis: Pain in right hand  Localized edema  Scar tissue  Stiffness of right hand, not elsewhere classified    Problem List Patient Active Problem List   Diagnosis Date Noted   Status post vaginal hysterectomy 01/29/2015   HLD (hyperlipidemia) 07/18/2013   Clinical depression 07/18/2013   Female genuine stress incontinence 05/23/2013   Incomplete bladder emptying 05/23/2013   Excessive urination at night 05/23/2013    Rosalyn Gess, OTR/L,CLT 12/24/2021, 9:57 AM  [facility] [address], [place], [address] Phone: [phone]   Fax:  [phone]  Name: Felicia Cantu MRN: [id number] Date of Birth: [birth year]

## 2021-12-28 ENCOUNTER — Ambulatory Visit: Payer: 59 | Admitting: Occupational Therapy

## 2021-12-28 DIAGNOSIS — R600 Localized edema: Secondary | ICD-10-CM

## 2021-12-28 DIAGNOSIS — M79641 Pain in right hand: Secondary | ICD-10-CM | POA: Diagnosis not present

## 2021-12-28 DIAGNOSIS — L905 Scar conditions and fibrosis of skin: Secondary | ICD-10-CM

## 2021-12-28 DIAGNOSIS — M25641 Stiffness of right hand, not elsewhere classified: Secondary | ICD-10-CM

## 2021-12-28 NOTE — Therapy (Signed)
[place] [facility] [address], [place], [address] Phone: [phone]   Fax:  [phone]  Occupational Therapy Treatment  Patient Details  Name: Felicia Cantu MRN: [id number] Date of Birth: December 16, 1960 Referring Provider (OT): Carlos [place]   Encounter Date: 12/28/2021   OT End of Session - 12/28/21 1048     Visit Number 22    Number of Visits 24    Date for OT Re-Evaluation 01/04/22    OT Start Time 0818    OT Stop Time 0904    OT Time Calculation (min) 46 min    Activity Tolerance Patient tolerated treatment well    Behavior During Therapy [facility] for tasks assessed/performed             Past Medical History:  Diagnosis Date   Breast tenderness    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   Subjective Assessment - 12/28/21 1046     Subjective  My thumb was really hurting a few times since of seeing you last.  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 Yes    Pain Score 4     Pain Location --   dorsal and volar wrist and foream- radial wrist and dorsal 4th and 5th hand   Pain Orientation Right    Pain Descriptors / Indicators Tightness;Aching    Pain Type Acute pain    Pain Onset More than a month ago               Patient arrived with reports of pain over her thumb and radial wrist over the weekend.  Could not do 2 pound weight radial ulnar deviation.  Patient negative for [facility] and thumb radial abduction.  Did had some pain with radial deviation.  And tenderness. Most tenderness in thumb webspace done some trigger point release this date.  Pain decreased to 3/10. Patient report pain with stiffness and tightness with wrist flexion extension-with wrist flexion being the worst pain on volar wrist and forearm 5/10. Appear patient continue to have some swelling inflammation in the hand limiting  intrinsic a fist and endrange PIP extension compensating with hyperextension of the [facility] putting stress on the volar plate.           OT Treatments/Exercises (OP) - 12/28/21 0001       RUE Fluidotherapy   Number Minutes Fluidotherapy 8 Minutes    RUE Fluidotherapy Location Wrist;Hand    Comments 2 cycles of ice for 1 minute done today for his inflammation increase motion prior to soft               Fluidotherapy to right wrist and hand with 2 rotations of ice to decrease edema prior to motion Focus this date after [employer] in contrast on soft tissue massage on volar PIPs webspace and volar wrist and forearm to increase endrange PIP extension. Light passive range of motion PIP extension afterwards on table. Upon assessment extension of digits endrange PIP within normal limits with less hyperextension at the [facility]. Intrinsic a fist down to appear better but not normal.    Change patient's home program and reminded again to do contrast in  the morning in the evening.  As well as to do Isotoner glove at night.   Put a hold on any strengthening with putty and 2 pound weight.   Patient to focus on soft tissue massage and volar digits as well as wrist and forearm -focusing on increase PIP extension putting less force on hyperextension of the metacarpals and the volar plate.   Followed by passive range of motion PIP on the table.  Followed by table slides for composite extension.   Patient can also do some soft tissue and trigger point release and thumb webspace.    We will reassess next session.        OT Education - 12/28/21 1048     Education Details progress and changes to HEP    Person(s) Educated Patient    Methods Explanation;Demonstration;Tactile cues;Verbal cues;Handout    Comprehension Verbalized understanding;Verbal cues required;Returned demonstration              OT Short Term Goals - 12/21/21 1118       OT SHORT TERM GOAL #1   Title Patient to be independent in home program to decrease hypersensitivity to palmar scar to tolerate using hand in bathing and dressing with pain less than 3/10    Baseline Unable to tolerate soft massage and soft textures more than 20 seconds.  Pain increases to a 5-10/10 with scar massage unable to use towel.  Or use any tools in hand. - NOW using but pain still increase 4/10    Time 3    Period Weeks    Status On-going    Target Date 01/04/22               OT Long Term Goals - 12/21/21 1119       OT LONG TERM GOAL #1   Title Patient hypersensitivity in the right hand improved for patient to be able to use hand normally to grip tools, utensils with pain less than 2/10    Baseline Pain and hypersensitivity over palmar scar increased from 5-10/10.  Tolerate soft massage as well as soft textures less than 20 seconds.  Unable to grip any and hand touch and scar. NOW pain still 4/10 - and if push or hit scar increase 10/10    Time 3    Period Weeks    Status On-going     Target Date 01/04/22      OT  LONG TERM GOAL #2   Title Active range of motion in right hand digits improved to within normal limits for composite flexion as well as full extension with no triggering and able to don and doff gloves and put hand in pocket.    Status Achieved      OT LONG TERM GOAL #3   Title Right wrist active range of motion improved to within normal limits in all planes symptom-free.    Baseline Wrist extension 58 with pain.  Flexion 85 slight pull over dorsal wrist NOW AROM WNL but still pull at end range for wrist flexion ,ext increase iwth resistance or weight 4/10    Time 2    Period Weeks    Status On-going    Target Date 01/04/22      OT LONG TERM GOAL #4   Title Right hand grip and prehension strength improved to more than 75% compared to left hand to be able to return to prior level of using hand and yard work, crafts and return back to work.    Baseline Unable to use hand in any gripping or prehension pain increases to 10/10, hypersensitivity with scar stiffness in all digits and triggering.  Strength not tested.  NOW grip increasing and prehension -but pain with grip and 3 point - more than 4/10    Time 2    Period Weeks    Status On-going    Target Date 01/04/22                   Plan - 12/28/21 1048     Clinical Impression Statement Patient present at OT evaluation  with dog bite to right hand with multiple puncture wounds on 09/17/21 - pt out 13 1/2  wks. Pt seen Ortho since last time and put on Mobic  - she made great progress from [facility]  in scar adhesion as well as tenderness tolerating different textures as well as massage. Scar still tender at distal edge and mobs distal to proximal. Patient with composite fist and full extension of digits - in combination of wrist flexion and extention - pain 4/10 with end range.  Pain mostly thumb MC/thenar and radiating into volar wrist/forearm  and dorsal hand over 4th and 3rd digits- pt has been compensating for  so long.  Patient started anti-inflammatory since last seen by eye doctor last week.  Patient report still same swelling and tightness.  Focus this date on fluidotherapy with contrast followed by soft tissue massage over volar digits ,forearm and palm.  As well as trigger point release in thumb webspace.  With palmar/ radial abduction.  Patient limited in intrinsic fist as well as endrange PIP extension compensating with hyperextension of MC -putting pressure over the volar plate.  Change home program for patient to do contrast followed by soft tissue massage to increase PIP extension.  With light passive range of motion on table and composite extension.  Patient to hold off on any strengthening until next session.   Patient can benefit from skilled OT services to decrease scar tissue, desensitization, edema and pain with increased motion and strength to return to prior level of function.             Patient will benefit from skilled therapeutic intervention in order to improve the following deficits and impairments:           Visit Diagnosis: Pain in right hand  Localized edema  Scar tissue  Stiffness of right hand, not  elsewhere classified    Problem List Patient Active Problem List   Diagnosis Date Noted   Status post vaginal hysterectomy 01/29/2015   HLD (hyperlipidemia) 07/18/2013   Clinical depression 07/18/2013   Female genuine stress incontinence 05/23/2013   Incomplete bladder emptying 05/23/2013   Excessive urination at night 05/23/2013    Rosalyn Gess, OTR/L,CLT 12/28/2021, 10:52 AM  [place] [facility] [address], [place], [address] Phone: [phone]   Fax:  [phone]  Name: ROZALIA DINO MRN: [id number] Date of Birth: 03/04/1960

## 2021-12-30 ENCOUNTER — Ambulatory Visit: Payer: 59 | Admitting: Occupational Therapy

## 2022-01-04 ENCOUNTER — Ambulatory Visit: Payer: 59 | Admitting: Occupational Therapy

## 2022-01-04 DIAGNOSIS — M79641 Pain in right hand: Secondary | ICD-10-CM | POA: Diagnosis not present

## 2022-01-04 DIAGNOSIS — M25641 Stiffness of right hand, not elsewhere classified: Secondary | ICD-10-CM

## 2022-01-04 DIAGNOSIS — L905 Scar conditions and fibrosis of skin: Secondary | ICD-10-CM

## 2022-01-04 DIAGNOSIS — R600 Localized edema: Secondary | ICD-10-CM

## 2022-01-04 NOTE — Therapy (Signed)
[place] [facility] [address], [place], [address] Phone: [phone]   Fax:  [phone]  Occupational Therapy Treatment  Patient Details  Name: Felicia Cantu MRN: [id number] Date of Birth: Apr 28, 1960 Referring Provider (OT): [place]   Encounter Date: 01/04/2022   OT End of Session - 01/04/22 1400     Visit Number 23    Number of Visits 24    Date for OT Re-Evaluation 01/04/22    OT Start Time 1112    OT Stop Time 1200    OT Time Calculation (min) 48 min    Activity Tolerance Patient tolerated treatment well    Behavior During Therapy [facility] for tasks assessed/performed             Past Medical History:  Diagnosis Date   Breast tenderness    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 Yes    Pain Score 6    volar and dorsal wrist and forearm-  dorsal hand flexio, ext   Pain Orientation Right    Pain Descriptors / Indicators Aching;Tightness;Sore    Pain Type Acute pain    Pain Onset More than a month ago                [facility] OT Assessment - 01/04/22 0001       Strength   Right Hand Grip (lbs) 20    Right Hand Lateral Pinch 9 lbs    Right Hand 3 Point Pinch 8 lbs    Left Hand Grip (lbs) 56    Left Hand Lateral Pinch 12 lbs    Left Hand 3 Point Pinch 11 lbs              Pt report cont edema in hand with tightness with composite flexion and extension as well as intrinsic fist. Patient report continues tightness and stiffness and soreness in volar wrist and forearm with wrist flexion extension. Reports sharp shooting pains over the weekend at the distal end of palmar scar.  When moving or touching or hitting it in a certain  way. Patient grip strength is decreasing but prehension is staying steady.        OT Treatments/Exercises (OP) - 01/04/22 0001       RUE Fluidotherapy   Number Minutes Fluidotherapy 8 Minutes    RUE Fluidotherapy Location Hand;Wrist    Comments 1 cycle ice prior to soft tissue , ROM and scar massage             Pt with 6/10 pain in right hand in palmar area, dorsal area and reports pain radiates volar and dorsal forearm.  As well as thenar eminence and thumb MC   Fluidotherapy to right wrist and hand with 1 rotation of ice to decrease edema prior to motion   Following fluidotherapy, pt was seen for manual therapy for soft tissue massage to hand, scar massage techniques to palmar surface to decrease adhesions and improve tissue mobility and tightness, decrease pain.  Pt continues to use [employer] care at night . Use of scar extractor to  distal end of palmar scar area to decrease adhesions as well as increased discomfort from distal to proximal.  Done this date in combination with intrinsic fist and extension. Used mini massager  also on distal end of scar in combination with  motion. Pain 6-8/10.  Still scar adhesion at the distal end of palmar scar   Therapeutic Exercises: Pt performing table slides with increase wrist extension  5 reps - 2 x day - slight pull over volar thumb and thenar eminence Cont  wrist and finger flexion over the armrest with elbow to side palm down and in neutral 5x5 reps Continues to hold off on weight for wrist and forearm      Did upgrade to teal medium putty for gripping, 20 reps to perform 3 times a day.  stop when feeling slight pull - digits only - no thumb engage or wrist flexion                 OT Education - 01/04/22 1359     Education Details progress and changes to HEP    Person(s) Educated Patient    Methods Explanation;Demonstration;Tactile cues;Verbal cues;Handout    Comprehension Verbalized understanding;Verbal cues required;Returned demonstration              OT Short Term Goals - 12/21/21 1118       OT SHORT TERM GOAL #1   Title Patient to be independent in home program to decrease hypersensitivity to palmar scar to tolerate using hand in bathing and dressing with pain less than 3/10    Baseline Unable to tolerate soft massage and soft textures more than 20 seconds.  Pain increases to a 5-10/10 with scar massage unable to use towel.  Or use any tools in hand. - NOW using but pain still increase 4/10    Time 3    Period Weeks    Status On-going    Target Date 01/04/22               OT Long Term Goals - 12/21/21 1119       OT LONG TERM GOAL #1   Title Patient hypersensitivity in the right hand improved for patient to be able to use hand normally to grip tools, utensils with pain less than 2/10    Baseline Pain and hypersensitivity over palmar scar increased from 5-10/10.  Tolerate soft massage as well as soft textures less than 20 seconds.  Unable to grip any and hand touch and scar. NOW pain  still 4/10 - and if push or hit scar increase 10/10    Time 3    Period Weeks    Status On-going     Target Date 01/04/22      OT LONG TERM GOAL #2   Title Active range of motion in right hand digits improved to within normal limits for composite flexion as well as full extension with no triggering and able to don and doff gloves and put hand in pocket.    Status Achieved      OT LONG TERM GOAL #3   Title Right wrist active range of motion improved to within normal limits in all planes symptom-free.    Baseline Wrist extension 58 with pain.  Flexion 85 slight pull over dorsal wrist NOW AROM WNL but still pull at end range for wrist flexion ,ext increase iwth resistance or weight 4/10    Time 2    Period Weeks    Status On-going    Target Date 01/04/22      OT LONG TERM GOAL #4   Title Right hand grip and prehension strength improved to more than 75% compared to left hand to be able to return to prior level of using hand and yard work, crafts and return back to work.    Baseline Unable to use hand in any gripping or prehension pain increases to 10/10, hypersensitivity with scar stiffness in all digits and triggering.  Strength not tested.  NOW grip increasing and prehension -but pain with grip and 3 point - more than 4/10    Time 2    Period Weeks    Status On-going    Target Date 01/04/22                   Plan - 01/04/22 1400     Clinical Impression Statement Patient present at OT evaluation  with dog bite to right hand with multiple puncture wounds on 09/17/21 - pt out 14 1/2  wks. Pt seen Ortho and put on Mobic now using for more than week  - she made great progress from [facility]  in scar adhesion as well as tenderness tolerating different textures as well as massage. Scar still tender at distal edge and mobs distal to proximal. Patient with composite fist and full extension of digits - in combination of wrist flexion and extention - pain 4/10 with end range.  Pain mostly thumb MC/thenar and radiating into volar wrist/forearm  and dorsal hand over 4th and 3rd digits- pt has been  compensating for so long. Patient report still same swelling and tightness.  Focus this date on fluidotherapy with contrast followed by soft tissue massage over volar digits ,forearm and palm.  As well as trigger point release in thumb webspace.  With palmar/ radial abduction. This date done focus on distal end of scar in combination of intrinsic fist and extention. Patient limited in intrinsic fist as well as endrange PIP extension compensating with hyperextension of MC -putting pressure over the volar plate.  Change home program for patient to do contrast followed by soft tissue massage to increase PIP extension.  With light passive range of motion on table and composite extension.  Patient to hold off on any strengthening using weight but did add putty teal med for gripping only because of decrease grip strength. Patient can benefit from skilled OT services to decrease scar tissue, desensitization, edema and pain with increased motion and strength to return to prior level of function.  OT Occupational Profile and History Problem Focused Assessment - Including review of records relating to presenting problem    Occupational performance deficits (Please refer to evaluation for details): ADL's;IADL's;Work;Play;Leisure;Social Participation    Body Structure / Function / Physical Skills ADL;Strength;Pain;Dexterity;Edema;UE functional use;IADL;ROM;Scar mobility;Flexibility;FMC    Rehab Potential Good    Clinical Decision Making Limited treatment options, no task modification necessary    Comorbidities Affecting Occupational Performance: None    Modification or Assistance to Complete Evaluation  No modification of tasks or assist necessary to complete eval    OT Frequency 2x / week    OT Duration 4 weeks    OT Treatment/Interventions Self-care/ADL training;Fluidtherapy;Contrast Bath;Therapeutic exercise;Iontophoresis;Paraffin;Manual Therapy;Patient/family education;Passive range of motion;Scar mobilization     Consulted and Agree with Plan of Care Patient             Patient will benefit from skilled therapeutic intervention in order to improve the following deficits and impairments:   Body Structure / Function / Physical Skills: ADL, Strength, Pain, Dexterity, Edema, UE functional use, IADL, ROM, Scar mobility, Flexibility, [facility]       Visit Diagnosis: Pain in right hand  Localized edema  Scar tissue  Stiffness of right hand, not elsewhere classified    Problem List Patient Active Problem List   Diagnosis Date Noted   Status post vaginal hysterectomy 01/29/2015   HLD (hyperlipidemia) 07/18/2013   Clinical depression 07/18/2013   Female genuine stress incontinence 05/23/2013   Incomplete bladder emptying 05/23/2013   Excessive urination at night 05/23/2013    Rosalyn Gess, OTR/L,CLT 01/04/2022, 2:05 PM  [place] [facility] [address] [place], [address] Phone: [phone]   Fax:  [phone]  Name: PINKIE MANGER MRN: [id number] Date of Birth: 04/25/1960

## 2022-01-07 ENCOUNTER — Ambulatory Visit: Payer: 59 | Admitting: Occupational Therapy

## 2022-01-07 DIAGNOSIS — M79641 Pain in right hand: Secondary | ICD-10-CM

## 2022-01-07 DIAGNOSIS — M25641 Stiffness of right hand, not elsewhere classified: Secondary | ICD-10-CM

## 2022-01-07 DIAGNOSIS — R600 Localized edema: Secondary | ICD-10-CM

## 2022-01-07 DIAGNOSIS — L905 Scar conditions and fibrosis of skin: Secondary | ICD-10-CM

## 2022-01-07 NOTE — Therapy (Signed)
[facility] [address], [place], [address] Phone: [phone]   Fax:  [phone]  Occupational Therapy Treatment  Patient Details  Name: Felicia Cantu MRN: [id number] Date of Birth: April 02, 1960 Referring Provider (OT): [place]   Encounter Date: 01/07/2022   OT End of Session - 01/07/22 0835     Visit Number 24    Number of Visits 32    Date for OT Re-Evaluation 02/25/22    OT Start Time 0820    OT Stop Time 0900    OT Time Calculation (min) 40 min    Activity Tolerance Patient tolerated treatment well    Behavior During Therapy [facility] for tasks assessed/performed             Past Medical History:  Diagnosis Date   Breast tenderness    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   Subjective Assessment - 01/07/22 0834     Subjective  That scar was on fire since last time - still tender at end part - if pressure on it not as much - doing my motion , putty and wrist stretches    Pertinent History Pt was bit by dog on 09/17/21 on R hand - xray negative for fracture- multiple wounds- was on antibiotics since seen at walk in clinic same day - seen by Ortho on 09/23/21 and then again on 10/07/21 she was referred to OT for desentitization, scar management - has trigger fingers 2nd thru 4th - with pain and stiffness -    Patient Stated Goals I want the pain, motion and strength in my dominant  hand better 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 Yes    Pain Score --   1-3/10 thumb, volar wrist and hand                         OT Treatments/Exercises (OP) - 01/07/22 0001       RUE Paraffin   Number Minutes Paraffin 8 Minutes    RUE Paraffin Location Hand   wrist   Comments Prior to ROM and soft            Pt with 1-4/10 pain in right hand with fisting and extention and reports pain radiates volar and dorsal forearm but less than last time   As well as thenar eminence and thumb MC      Pt report distal end of scar rough spot and tender when touch or hit - done this date r manual therapy for soft tissue massage to hand, scar massage techniques to palmar surface  over cica scar pad to decrease  adhesions and improve tissue mobility and tightness, decrease pain.   Pt continues to use CICA care at night and with scar massage  .   Used mini massager  also on distal end of scar in combination with motion. But over cica scar pad  Less pain - 1-2/10  Pt to do same at home    Therapeutic Exercises: Pt performing table slides with increase wrist extension  5 reps - 2 x day - slight pull over volar thumb and thenar eminence Cont  wrist and finger flexion over the armrest with elbow to side palm down and in neutral 5x5 reps Continues to hold off on weight for wrist and forearm       teal medium putty for gripping, 20 reps to perform 3 times a day.  stop when feeling slight pull - digits only - no thumb engage or wrist flexion         OT Education - 01/07/22 0835     Education Details progress and changes to HEP    Person(s) Educated Patient    Methods Explanation;Demonstration;Tactile cues;Verbal cues;Handout    Comprehension Verbalized understanding;Verbal cues required;Returned demonstration              OT Short Term Goals - 01/07/22 1910       OT SHORT TERM GOAL #1   Title Patient to be  independent in home program to decrease hypersensitivity to palmar scar to tolerate using hand in bathing and dressing with pain less than 3/10    Baseline Unable to tolerate soft massage and soft textures more than 20 seconds.  Pain increases to a 5-10/10 with scar massage unable to use towel.  Or use any tools in hand. - NOW pain only at distal end of scar upon touching more than pressure    Time 3    Period Weeks    Status On-going    Target Date 01/28/22               OT Long Term Goals - 01/07/22 1911       OT LONG TERM GOAL #1   Title Patient hypersensitivity in the right hand improved for patient to be able to use hand normally to grip tools, utensils with pain less than 2/10    Baseline Pain and hypersensitivity over palmar scar increased from 5-10/10.  Tolerate soft massage as well as soft textures less than 20 seconds.  Unable to grip any and hand touch and scar. NOW distal end of scar tender when hit or touch - not able to grip tools or utencils for long time or when touching that area    Time 6    Period Weeks    Status On-going    Target Date 02/18/22      OT LONG TERM GOAL #2   Title Active range of motion in right hand digits improved to within normal limits for composite flexion as well as full extension with no triggering and able to don and doff gloves and put hand in pocket.    Status Achieved      OT LONG TERM GOAL #3   Title Right wrist active range of motion improved to within normal limits in all planes symptom-free.    Status Achieved      OT LONG TERM GOAL #4   Title Right hand grip and prehension strength improved to more than 75% compared to left hand to be able to return to prior level of using hand and  yard work, crafts and return back to work.    Baseline Unable to use hand in any gripping or prehension pain increases to 10/10, hypersensitivity with scar stiffness in all digits and triggering.  Strength not tested.  NOW grip and prehension increasing  but still painful but less than 4/10    Time 7    Period Weeks    Status On-going    Target Date 02/25/22                   Plan - 01/07/22 1312     Clinical Impression Statement Patient present at OT evaluation  with dog bite to right hand with multiple puncture wounds on 09/17/21 - pt out 16  wks.   Patient made great progress in scar adhesion, tenderness, pain, edema as well as ROM in digits and wrist on R dominant hand.  Scar still tender at distal edge and mobs distal to proximal. Patient with composite fist and full extension of digits - in combination of wrist flexion and extention - pain 2-4/10 with end range - and using 2 lbs weight. Pain mostly thumb MC/thenar and radiating into volar wrist/forearm  and dorsal hand over 4th and 3rd digits- pt has been compensating for so long.  Pt to cont at home scar massage and strength in wrist 4+/5 - focussing more on scar, pain free ROM and grip strenghening at this time.  Continued progress with right hand, remains limited by  edema in hand and  pain  which can be disperse along hand and forearm.  Continue to work towards decreasing edema, pain, improving ROM and functional use of right UE for daily tasks.   Pt has been on anti-inflammatory since seen by the-but do not appear to have improvement in edema limiting endrange intrinsic in composite fist with the wrist flexion, ext. Patient can benefit from skilled OT services to decrease scar tissue, edema and pain with increased motion and strength to return to prior level of function.    OT Occupational Profile and History Problem Focused Assessment - Including review of records relating to presenting problem    Occupational performance deficits (Please refer to evaluation for details): ADL's;IADL's;Work;Play;Leisure;Social Participation    Body Structure / Function / Physical Skills ADL;Strength;Pain;Dexterity;Edema;UE functional use;IADL;ROM;Scar mobility;Flexibility;FMC    Rehab Potential Good     Clinical Decision Making Limited treatment options, no task modification necessary    Comorbidities Affecting Occupational Performance: None    Modification or Assistance to Complete Evaluation  No modification of tasks or assist necessary to complete eval    OT Frequency 2x / week    OT Duration 6 weeks    OT Treatment/Interventions Self-care/ADL training;Fluidtherapy;Contrast Bath;Therapeutic exercise;Iontophoresis;Paraffin;Manual Therapy;Patient/family education;Passive range of motion;Scar mobilization    Consulted and Agree with Plan of Care Patient             Patient will benefit from skilled therapeutic intervention in order to improve the following deficits and impairments:   Body Structure / Function / Physical Skills: ADL, Strength, Pain, Dexterity, Edema, UE functional use, IADL, ROM, Scar mobility, Flexibility, [facility]       Visit Diagnosis: Pain in right hand  Localized edema  Scar tissue  Stiffness of right hand, not elsewhere classified    Problem List Patient Active Problem List   Diagnosis Date Noted   Status post vaginal hysterectomy 01/29/2015   HLD (hyperlipidemia) 07/18/2013   Clinical depression 07/18/2013   Female genuine stress incontinence 05/23/2013   Incomplete bladder emptying  05/23/2013   Excessive urination at night 05/23/2013    Rosalyn Gess, OTR/L,CLT 01/07/2022, 7:14 PM  [facility] [address] [place], [address] Phone: [phone]   Fax:  [phone]  Name: Felicia Cantu MRN: [id number] Date of Birth: [birth year]

## 2022-01-12 ENCOUNTER — Ambulatory Visit: Payer: 59 | Attending: Orthopedic Surgery | Admitting: Occupational Therapy

## 2022-01-12 DIAGNOSIS — M25641 Stiffness of right hand, not elsewhere classified: Secondary | ICD-10-CM | POA: Insufficient documentation

## 2022-01-12 DIAGNOSIS — L905 Scar conditions and fibrosis of skin: Secondary | ICD-10-CM | POA: Diagnosis present

## 2022-01-12 DIAGNOSIS — R600 Localized edema: Secondary | ICD-10-CM | POA: Insufficient documentation

## 2022-01-12 DIAGNOSIS — M79641 Pain in right hand: Secondary | ICD-10-CM | POA: Insufficient documentation

## 2022-01-12 NOTE — Therapy (Signed)
[facility] [address], [place], [address] Phone: [phone]   Fax:  [phone]  Occupational Therapy Treatment  Patient Details  Name: CARON ODE MRN: [id number] Date of Birth: 1960-11-01 Referring Provider (OT): [place]   Encounter Date: 01/12/2022   OT End of Session - 01/12/22 1250     Visit Number 25    Number of Visits 32    Date for OT Re-Evaluation 02/25/22    OT Start Time 0820    OT Stop Time 0900    OT Time Calculation (min) 40 min    Activity Tolerance Patient tolerated treatment well    Behavior During Therapy [facility] for tasks assessed/performed             Past Medical History:  Diagnosis Date   Breast tenderness    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   Subjective Assessment - 01/12/22 1249     Subjective  I payed attention this weekend when it hurts- turning faucet, driving pulling on steering wheel-and tearing open package    Pertinent History Pt was bit by dog on 09/17/21 on R hand - xray negative for fracture- multiple wounds- was on antibiotics since seen at walk in clinic same day - seen by Ortho on 09/23/21 and then again on 10/07/21 she was referred to OT for desentitization, scar management - has trigger fingers 2nd thru 4th - with pain and stiffness -    Patient Stated Goals I want the pain, motion and strength in my dominant hand better so 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 Yes   2-4/10   Pain Location --   dorsal hand, volar wrist and thumb   Pain Orientation Right    Pain Descriptors / Indicators Aching;Tightness    Pain Type Acute pain                [facility] OT Assessment - 01/12/22 0001       Strength   Right Hand Grip (lbs) 45    Right Hand Lateral Pinch 10 lbs    Right Hand 3 Point Pinch 9 lbs    Left Hand Grip (lbs) 65                      OT Treatments/Exercises (OP) - 01/12/22 0001       Ultrasound   Ultrasound Location webspace    Ultrasound Parameters 3.3 mHZ, 20%, 0.8 int    Ultrasound Goals Pain      RUE Paraffin   Number Minutes Paraffin 8 Minutes    RUE Paraffin Location Hand    Comments prior to  scar massage            Pt with 0-4/10 pain in right hand with fisting and extention and reports pain radiates volar and dorsal forearm/hand and thumb - less since last week       Pt report distal end of scar rough spot and tender when touch or hit -  improved since last time - done this date  manual therapy for soft tissue massage to hand, scar massage techniques to palmar surface  over cica scar pad to decrease adhesions and improve tissue mobility and tightness, decrease pain.  DONE with digits and wrist in extention - with scar at stretch Pt continues to use CICA care at night and with scar massage  .   Used mini massager  also on distal end of scar in combination with full extention. But over cica scar pad  Less pain - 1-2/10  Pt to do same at home     Therapeutic Exercises: Pt performing table slides with increase wrist extension  5 reps - 2 x day - slight pull over volar thumb and thenar eminence Cont  wrist and finger flexion over the armrest with elbow to side palm down and in neutral 5x5 reps Continues to hold off on weight for wrist and forearm       teal medium putty for gripping, 20 reps to perform 3 times a day.  stop when  feeling slight pull - digits only - no thumb engage or wrist flexion  SAME HEP           OT Education - 01/12/22 1250     Education Details progress and changes to HEP    Person(s) Educated Patient    Methods Explanation;Demonstration;Tactile cues;Verbal cues;Handout    Comprehension Verbalized understanding;Verbal cues required;Returned demonstration              OT Short Term Goals - 01/07/22 1910       OT SHORT TERM GOAL #1   Title Patient to be independent in home program to decrease hypersensitivity to palmar scar to tolerate using hand in bathing and dressing with pain less than 3/10    Baseline Unable to tolerate soft massage and soft textures more than 20 seconds.  Pain increases to a 5-10/10 with scar massage unable to use towel.  Or use any tools in hand. - NOW pain only at distal end of scar upon touching more than pressure    Time 3    Period Weeks    Status On-going    Target Date 01/28/22               OT Long Term Goals - 01/07/22 1911       OT LONG TERM GOAL #1   Title Patient hypersensitivity in the right hand improved for patient to be able to use hand normally to grip tools, utensils with pain less than 2/10    Baseline Pain and hypersensitivity over palmar scar increased from 5-10/10.  Tolerate soft massage as well as soft textures less than 20 seconds.  Unable to grip any and hand touch and scar. NOW distal end of scar tender when hit or touch - not able to grip tools or utencils for long time or when touching that area    Time 6    Period Weeks    Status On-going    Target Date 02/18/22      OT LONG TERM GOAL #2   Title Active range of motion in  right hand digits improved to within normal limits for composite flexion as well as full extension with no triggering and able to don and doff gloves and put hand in pocket.    Status Achieved      OT LONG TERM GOAL #3   Title Right wrist active range of motion improved to within normal limits in  all planes symptom-free.    Status Achieved      OT LONG TERM GOAL #4   Title Right hand grip and prehension strength improved to more than 75% compared to left hand to be able to return to prior level of using hand and yard work, crafts and return back to work.    Baseline Unable to use hand in any gripping or prehension pain increases to 10/10, hypersensitivity with scar stiffness in all digits and triggering.  Strength not tested.  NOW grip and prehension increasing but still painful but less than 4/10    Time 7    Period Weeks    Status On-going    Target Date 02/25/22                   Plan - 01/12/22 1251     Clinical Impression Statement Patient present at OT evaluation  with dog bite to right hand with multiple puncture wounds on 09/17/21 - pt out 16 1/2  wks.   Patient made great progress in scar adhesion, tenderness, pain, edema as well as ROM in digits and wrist on R dominant hand.  Scar still tender at distal edge and mobs distal to proximal. Patient with composite fist and full extension of digits - in combination of wrist flexion and extention - pain 2-4/10 with end range - and using 2 lbs weight. Pain mostly thumb MC/thenar and radiating into volar wrist/forearm  and dorsal hand over 4th and 3rd digits- pt has been compensating for so long.  Pt to cont at home scar massage and strength in wrist 4+/5 - focussing more on scar, pain free ROM and grip strenghening at this time.  Made great progress since last week in grip strength. Continued progress with right hand, remains limited by  edema in hand and  pain  which can be disperse along hand and forearm.  Continue to work towards decreasing edema, pain, improving ROM and functional use of right UE for daily tasks.   Pt has been on anti-inflammatory since seen by the MD but do not appear to have improvement in edema limiting endrange intrinsic in composite fist with the wrist flexion, ext. Patient can benefit from skilled OT  services to decrease scar tissue, edema and pain with increased motion and strength to return to prior level of function.    OT Occupational Profile and History Problem Focused Assessment - Including review of records relating to presenting problem    Occupational performance deficits (Please refer to evaluation for details): ADL's;IADL's;Work;Play;Leisure;Social Participation    Body Structure / Function / Physical Skills ADL;Strength;Pain;Dexterity;Edema;UE functional use;IADL;ROM;Scar mobility;Flexibility;FMC    Rehab Potential Good    Clinical Decision Making Limited treatment options, no task modification necessary    Comorbidities Affecting Occupational Performance: None    Modification or Assistance to Complete Evaluation  No modification of tasks or assist necessary to complete eval    OT Frequency 2x / week    OT Duration 6 weeks    OT Treatment/Interventions Self-care/ADL training;Fluidtherapy;Contrast Bath;Therapeutic exercise;Iontophoresis;Paraffin;Manual Therapy;Patient/family education;Passive range of motion;Scar mobilization    Consulted and Agree with Plan of Care  Patient             Patient will benefit from skilled therapeutic intervention in order to improve the following deficits and impairments:   Body Structure / Function / Physical Skills: ADL, Strength, Pain, Dexterity, Edema, UE functional use, IADL, ROM, Scar mobility, Flexibility, [facility]       Visit Diagnosis: Pain in right hand  Localized edema  Scar tissue    Problem List Patient Active Problem List   Diagnosis Date Noted   Status post vaginal hysterectomy 01/29/2015   HLD (hyperlipidemia) 07/18/2013   Clinical depression 07/18/2013   Female genuine stress incontinence 05/23/2013   Incomplete bladder emptying 05/23/2013   Excessive urination at night 05/23/2013    Rosalyn Gess, OTR/L,CLT 01/12/2022, 12:55 PM  [facility] [address] [place], [address] Phone: [phone]   Fax:  [phone]  Name: JORA GALLUZZO MRN: [id number] Date of Birth: 10-12-1960

## 2022-01-15 ENCOUNTER — Ambulatory Visit: Payer: 59 | Admitting: Occupational Therapy

## 2022-01-15 DIAGNOSIS — L905 Scar conditions and fibrosis of skin: Secondary | ICD-10-CM

## 2022-01-15 DIAGNOSIS — R600 Localized edema: Secondary | ICD-10-CM

## 2022-01-15 DIAGNOSIS — M79641 Pain in right hand: Secondary | ICD-10-CM | POA: Diagnosis not present

## 2022-01-15 DIAGNOSIS — M25641 Stiffness of right hand, not elsewhere classified: Secondary | ICD-10-CM

## 2022-01-15 NOTE — Therapy (Signed)
[facility] [address], [place], [address] Phone: [phone]   Fax:  [phone]  Occupational Therapy Treatment  Patient Details  Name: Felicia Cantu MRN: [id number] Date of Birth: 01-03-1961 Referring Provider (OT): [place]   Encounter Date: 01/15/2022   OT End of Session - 01/15/22 1201     Visit Number 26    Number of Visits 32    Date for OT Re-Evaluation 02/25/22    OT Start Time 0915    OT Stop Time 1002    OT Time Calculation (min) 47 min    Activity Tolerance Patient tolerated treatment well    Behavior During Therapy [facility] for tasks assessed/performed             Past Medical History:  Diagnosis Date   Breast tenderness    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   Subjective Assessment - 01/15/22 1159     Subjective  My hand is much better than it was - try to use it but like writing bothers my thumb and typing - I am seeing the PA earlier - around the 13th    Pertinent History Pt was bit by dog on 09/17/21 on R hand - xray negative for fracture- multiple wounds- was on antibiotics since seen at walk in clinic same day - seen by Ortho on 09/23/21 and then again on 10/07/21 she was referred to OT for desentitization, scar management - has trigger fingers 2nd thru 4th - with pain and stiffness -    Patient Stated Goals I want the pain, motion and strength in my dominant  hand better 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 Yes    Pain Score --   2-4/10 thumb , volar wrist and forearm   Pain Orientation Right    Pain Descriptors / Indicators Aching;Tightness    Pain Type Acute pain                 Patient arrive with pain continues to be between a 1-4/10 in the R thumb and thenar eminence into the volar wrist and forearm.  As well as slight pull over the dorsal third and fourth digits with composite fist.  Pain can still increase with use to a 6-7/10. Patient with increased pain and discomfort with carrying a weight more than 3 and 4 pounds. Initiated and add to home program elbow flexion and extension with 2 pound weight 12 reps as well as shoulder punches forward and overhead 12 reps each 2 times a day Reminded patient not to over grip causing pain over the flexors in the forearm.  Patient report tapping fingers  as well as writing and typing on the computer causing  increased pain         OT Treatments/Exercises (OP) - 01/15/22 0001       RUE Paraffin   Number Minutes Paraffin 8 Minutes    RUE Paraffin Location Hand   wrist prior to   Comments prior ot ROM and scar massage                    Pt report distal end of scar rough spot and tender when touch or hit -  improved since last week- done this date  manual therapy for soft tissue massage to hand, scar massage techniques to palmar surface  over cica scar pad to decrease adhesions and improve tissue mobility and tightness, decrease pain.  DONE with digits and wrist in extention - with scar at stretch Pt continues to use CICA care at night and with scar massage  .   Used mini massager  also on distal end of scar in combination with full extention. But over cica scar pad  Less pain - 1-2/10  Pt to do same at home     Therapeutic Exercises:  Pt performing table slides with increase wrist extension  5 reps - 2 x day - slight pull over  volar thumb and thenar eminence Reviewed and add with patient doing individually finger flexion alternating and blocking of the fingers.  As well as doing a like a peace sign, [place] sign, hang loose, showing to, showing 3, showing 1 or 4.  Facilitating separate tendon glides.   Also says patient's writing with a regular pen, and large grip patient still over gripping with thumb.  Patient done good with a tripack pencil grip attached to the pen facilitating not over gripping.   Patient to practice writing a few sentences and increased time to like 30 seconds at a time 2-minute with pain less than a 2/10.    Teal medium putty for gripping, 20 reps to perform 3 times a day.  stop when feeling slight pull - digits only - no thumb engage or wrist flexion           OT Education - 01/15/22 1201     Education Details progress and changes to HEP    Person(s) Educated Patient    Methods Explanation;Demonstration;Tactile cues;Verbal cues;Handout    Comprehension Verbalized understanding;Verbal cues required;Returned demonstration              OT Short Term Goals - 01/07/22 1910       OT SHORT TERM GOAL #1   Title Patient to be independent in home program to decrease hypersensitivity to palmar scar to tolerate using hand in bathing and dressing with pain less than 3/10    Baseline Unable to tolerate soft massage and soft textures more than 20 seconds.  Pain increases to a 5-10/10 with scar massage unable to use towel.  Or use any tools in hand. - NOW pain only at distal end of scar upon touching more than pressure    Time 3    Period Weeks    Status On-going    Target Date 01/28/22               OT Long Term Goals - 01/07/22 1911       OT LONG TERM GOAL #1   Title Patient hypersensitivity in the right hand improved for patient to be able to use hand normally to grip tools, utensils with  pain less than 2/10    Baseline Pain and hypersensitivity over palmar scar increased from  5-10/10.  Tolerate soft massage as well as soft textures less than 20 seconds.  Unable to grip any and hand touch and scar. NOW distal end of scar tender when hit or touch - not able to grip tools or utencils for long time or when touching that area    Time 6    Period Weeks    Status On-going    Target Date 02/18/22      OT LONG TERM GOAL #2   Title Active range of motion in right hand digits improved to within normal limits for composite flexion as well as full extension with no triggering and able to don and doff gloves and put hand in pocket.    Status Achieved      OT LONG TERM GOAL #3   Title Right wrist active range of motion improved to within normal limits in all planes symptom-free.    Status Achieved      OT LONG TERM GOAL #4   Title Right hand grip and prehension strength improved to more than 75% compared to left hand to be able to return to prior level of using hand and yard work, crafts and return back to work.    Baseline Unable to use hand in any gripping or prehension pain increases to 10/10, hypersensitivity with scar stiffness in all digits and triggering.  Strength not tested.  NOW grip and prehension increasing but still painful but less than 4/10    Time 7    Period Weeks    Status On-going    Target Date 02/25/22                   Plan - 01/15/22 1201     Clinical Impression Statement Patient present at OT evaluation  with dog bite to right hand with multiple puncture wounds on 09/17/21 - pt out 17 wks.   Patient made great progress in scar adhesion, tenderness, pain, edema as well as ROM in digits and wrist on R dominant hand.  Scar still tender at distal edge and mobs distal to proximal. Patient most pain in composite fist and full extension of digits - in combination of wrist flexion and extention - pain 2-4/10 with end range and can increase to 6/10 with use.  Pain mostly thumb MC/thenar and radiating into volar wrist/forearm  and dorsal hand over 4th and  3rd digits.  Made great progress since last week in grip strength.  This date adress functional use of carrrying weight- pain increase over 3-4 lbs - pt to initiate some 2 lbs elbow and shoulder exercises at home. As well as individual digits exercises and writing with tri pot pencil to decrease pinching. Continued progress with right hand, remains limited by  edema in hand and  pain  which can be disperse along hand and forearm.  Continue to work towards decreasing edema, pain, improving ROM and functional use of right UE for daily tasks.   Pt has been on anti-inflammatory since seen by the MD but do not appear to have improvement in edema but pain did improve. Patient can benefit from skilled OT services to decrease scar tissue, edema and pain with increased motion and strength to return to prior level of function.    OT Occupational Profile and History Problem Focused Assessment - Including review of records relating to presenting problem    Occupational performance deficits (Please  refer to evaluation for details): ADL's;IADL's;Work;Play;Leisure;Social Participation    Body Structure / Function / Physical Skills ADL;Strength;Pain;Dexterity;Edema;UE functional use;IADL;ROM;Scar mobility;Flexibility;FMC    Rehab Potential Good    Clinical Decision Making Limited treatment options, no task modification necessary    Comorbidities Affecting Occupational Performance: None    Modification or Assistance to Complete Evaluation  No modification of tasks or assist necessary to complete eval    OT Frequency 2x / week    OT Duration 6 weeks    OT Treatment/Interventions Self-care/ADL training;Fluidtherapy;Contrast Bath;Therapeutic exercise;Iontophoresis;Paraffin;Manual Therapy;Patient/family education;Passive range of motion;Scar mobilization    Consulted and Agree with Plan of Care Patient             Patient will benefit from skilled therapeutic intervention in order to improve the following deficits and  impairments:   Body Structure / Function / Physical Skills: ADL, Strength, Pain, Dexterity, Edema, UE functional use, IADL, ROM, Scar mobility, Flexibility, [facility]       Visit Diagnosis: Pain in right hand  Localized edema  Scar tissue  Stiffness of right hand, not elsewhere classified    Problem List Patient Active Problem List   Diagnosis Date Noted   Status post vaginal hysterectomy 01/29/2015   HLD (hyperlipidemia) 07/18/2013   Clinical depression 07/18/2013   Female genuine stress incontinence 05/23/2013   Incomplete bladder emptying 05/23/2013   Excessive urination at night 05/23/2013    Rosalyn Gess, OTR/L,CLT 01/15/2022, 12:07 PM  [place] [facility] [address] [place], [address] Phone: [phone]   Fax:  [phone]  Name: Felicia Cantu MRN: [id number] Date of Birth: [birth year]

## 2022-01-19 ENCOUNTER — Ambulatory Visit: Payer: 59 | Admitting: Occupational Therapy

## 2022-01-19 DIAGNOSIS — L905 Scar conditions and fibrosis of skin: Secondary | ICD-10-CM

## 2022-01-19 DIAGNOSIS — R600 Localized edema: Secondary | ICD-10-CM

## 2022-01-19 DIAGNOSIS — M79641 Pain in right hand: Secondary | ICD-10-CM

## 2022-01-19 DIAGNOSIS — M25641 Stiffness of right hand, not elsewhere classified: Secondary | ICD-10-CM

## 2022-01-19 NOTE — Therapy (Signed)
[place] [facility] [address] [place], [address] Phone: [phone]   Fax:  [phone]  Occupational Therapy Treatment  Patient Details  Name: Felicia Cantu MRN: [id number] Date of Birth: [birth year] Referring Provider (OT): [place]   Encounter Date: 01/19/2022   OT End of Session - 01/19/22 0843     Visit Number 27    Number of Visits 32    Date for OT Re-Evaluation 02/25/22    OT Start Time 0821    OT Stop Time 0903    OT Time Calculation (min) 42 min    Activity Tolerance Patient tolerated treatment well    Behavior During Therapy [facility] for tasks assessed/performed             Past Medical History:  Diagnosis Date   Breast tenderness    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   Subjective Assessment - 01/19/22 0841     Subjective  Cleaning cat litter and fireplace that is light- dropping things less. Writing hurts my thumb and in the middle of palm- and slow. Doing better with bathing and dressing using my R hand now. Can hold cup now .Did not try typing again - last time it hurt. Pain is not as high anymore but still swelling in my knuckles and over back of hand.    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 Yes    Pain Score 3     Pain Location Wrist   Hand   Pain Orientation Right    Pain Descriptors / Indicators Aching;Tightness    Pain Type Acute pain    Pain Onset More than a month ago    Pain Frequency Intermittent                [facility] OT Assessment - 01/19/22 0001       Strength   Right Hand Grip (lbs) 32   pain in thumb was last week 45   Right Hand Lateral Pinch 9 lbs    Right Hand 3 Point Pinch 9 lbs   pain in thumb   Left Hand Grip (lbs) 65    Left Hand Lateral Pinch  12 lbs    Left Hand 3 Point Pinch 11 lbs              Reviewed 2 lbs weight for shoulder and elbow in all planes - 12 reps - not to hold weight tight Pt to start back with sup/pro and RD, UD of wrist  12 reps   2 lbs         OT Treatments/Exercises (OP) - 01/19/22 0001       RUE Paraffin   Number Minutes Paraffin 8 Minutes    RUE Paraffin Location Hand    Comments prior to soft tissue and ROM            Pt report distal end of scar rough spot and tender when touch some objects or hit -  improved since last week- done this date  manual therapy for soft tissue massage to hand, scar massage techniques to palmar surface  over cica scar pad to decrease adhesions and improve tissue mobility and tightness, decrease pain.  DONE with digits and wrist in extention - with scar at stretch Pt continues to use CICA care at night and with scar massage  .   Used mini massager  also on distal end of scar in combination with full extention. But over cica scar pad  Less pain - 1-2/10  Pt to do same at home     Therapeutic Exercises:   Pt performing table slides with increase wrist extension  5 reps - 2 x day -  slight pull over volar thumb and thenar eminence Reviewed again and patient doing individually finger flexion alternating and blocking of the fingers.  As well as doing a peace sign, [place] sign, hang loose, showing 2, showing 3, showing 1 or 4.  Facilitating separate tendon glides.   Also patient to  do writing with a regular pen, and large grip patient still over gripping with thumb.  Patient done good with a tripack pencil grip attached to the pen facilitating not over gripping.   Patient to practice writing a few sentences and increased time to like 30 seconds at a time 2-minute with pain less than a 2/10.   Also to dong some typing on computer -    Teal medium putty for gripping, 20 reps to perform 3 times a day.  stop when feeling slight pull - digits only - no thumb engage or wrist flexion  Tenderness cont to be in thumb webspace - with trigger point tenderness followed by AAROM for thumb PA and RA                 OT Education - 01/19/22 0842     Education Details progress and changes to HEP    Person(s) Educated Patient    Methods Explanation;Demonstration;Tactile cues;Verbal cues;Handout    Comprehension Verbalized understanding;Verbal cues required;Returned demonstration              OT Short Term Goals - 01/07/22 1910       OT SHORT TERM GOAL #1   Title Patient to be independent in home program to decrease hypersensitivity to palmar scar to tolerate using hand in bathing and dressing with pain less than 3/10    Baseline Unable to tolerate soft massage and soft textures more than 20 seconds.  Pain increases to a 5-10/10 with scar massage unable to use towel.  Or use any tools in hand. - NOW pain only at distal end of scar upon touching more than pressure  Time 3    Period Weeks    Status On-going    Target Date 01/28/22               OT Long Term Goals - 01/07/22 1911       OT LONG TERM GOAL #1   Title Patient hypersensitivity in the right hand  improved for patient to be able to use hand normally to grip tools, utensils with pain less than 2/10    Baseline Pain and hypersensitivity over palmar scar increased from 5-10/10.  Tolerate soft massage as well as soft textures less than 20 seconds.  Unable to grip any and hand touch and scar. NOW distal end of scar tender when hit or touch - not able to grip tools or utencils for long time or when touching that area    Time 6    Period Weeks    Status On-going    Target Date 02/18/22      OT LONG TERM GOAL #2   Title Active range of motion in right hand digits improved to within normal limits for composite flexion as well as full extension with no triggering and able to don and doff gloves and put hand in pocket.    Status Achieved      OT LONG TERM GOAL #3   Title Right wrist active range of motion improved to within normal limits in all planes symptom-free.    Status Achieved      OT LONG TERM GOAL #4   Title Right hand grip and prehension strength improved to more than 75% compared to left hand to be able to return to prior level of using hand and yard work, crafts and return back to work.    Baseline Unable to use hand in any gripping or prehension pain increases to 10/10, hypersensitivity with scar stiffness in all digits and triggering.  Strength not tested.  NOW grip and prehension increasing but still painful but less than 4/10    Time 7    Period Weeks    Status On-going    Target Date 02/25/22                   Plan - 01/19/22 0843     Clinical Impression Statement Patient present at OT evaluation  with dog bite to right hand with multiple puncture wounds on 09/17/21 - pt is about 4 months our from injury. Patient made great progress in scar adhesion, tenderness, pain, edema as well as ROM in digits and wrist on R dominant hand.  Scar still tender at distal edge. Patient most pain in composite fist and full extension of digits - in combination of wrist flexion and  extention - pain 2-4/10 with end range and can increase to 6/10 with use.  Pain at times in thumb webspace  and radiating into  thumb thenar eminence and radial wrist.  Last 2 sessions adress functional use of carrrying weight- pain increase over 3-4 lbs - initiate some 2 lbs wrist, elbow and shoulder exercises at home. As well as individual digits exercises and writing with tri pot pencil to decrease pinching. Continued progress with right hand, remains limited by  edema in hand and  pain  which can be disperse along hand and forearm.  Continue to work towards decreasing edema, pain, improving ROM and functional use of right UE for daily tasks.   Pt has been on anti-inflammatory since seen by the MD but do not appear to have  improvement in edema but pain did improve. Patient can benefit from skilled OT services to decrease scar tissue, edema and pain with increased motion and strength to return to prior level of function.    OT Occupational Profile and History Problem Focused Assessment - Including review of records relating to presenting problem    Occupational performance deficits (Please refer to evaluation for details): ADL's;IADL's;Work;Play;Leisure;Social Participation    Body Structure / Function / Physical Skills ADL;Strength;Pain;Dexterity;Edema;UE functional use;IADL;ROM;Scar mobility;Flexibility;FMC    Rehab Potential Good    Clinical Decision Making Limited treatment options, no task modification necessary    Comorbidities Affecting Occupational Performance: None    Modification or Assistance to Complete Evaluation  No modification of tasks or assist necessary to complete eval    OT Frequency 2x / week    OT Duration 6 weeks    OT Treatment/Interventions Self-care/ADL training;Fluidtherapy;Contrast Bath;Therapeutic exercise;Iontophoresis;Paraffin;Manual Therapy;Patient/family education;Passive range of motion;Scar mobilization    Consulted and Agree with Plan of Care Patient              Patient will benefit from skilled therapeutic intervention in order to improve the following deficits and impairments:   Body Structure / Function / Physical Skills: ADL, Strength, Pain, Dexterity, Edema, UE functional use, IADL, ROM, Scar mobility, Flexibility, [facility]       Visit Diagnosis: Pain in right hand  Localized edema  Stiffness of right hand, not elsewhere classified  Scar tissue    Problem List Patient Active Problem List   Diagnosis Date Noted   Status post vaginal hysterectomy 01/29/2015   HLD (hyperlipidemia) 07/18/2013   Clinical depression 07/18/2013   Female genuine stress incontinence 05/23/2013   Incomplete bladder emptying 05/23/2013   Excessive urination at night 05/23/2013    Rosalyn Gess, OTR/L,CLT 01/19/2022, 2:06 PM  [facility] [address] [place], [address] Phone: [phone]   Fax:  [phone]  Name: Felicia Cantu MRN: [id number] Date of Birth: Oct 20, 1960

## 2022-01-20 DIAGNOSIS — W540XXA Bitten by dog, initial encounter: Secondary | ICD-10-CM | POA: Diagnosis not present

## 2022-01-20 DIAGNOSIS — E119 Type 2 diabetes mellitus without complications: Secondary | ICD-10-CM | POA: Diagnosis not present

## 2022-01-20 DIAGNOSIS — Z6830 Body mass index (BMI) 30.0-30.9, adult: Secondary | ICD-10-CM | POA: Diagnosis not present

## 2022-01-20 DIAGNOSIS — S61252A Open bite of right middle finger without damage to nail, initial encounter: Secondary | ICD-10-CM | POA: Diagnosis not present

## 2022-01-21 ENCOUNTER — Ambulatory Visit: Payer: 59 | Admitting: Occupational Therapy

## 2022-01-21 DIAGNOSIS — L905 Scar conditions and fibrosis of skin: Secondary | ICD-10-CM

## 2022-01-21 DIAGNOSIS — M79641 Pain in right hand: Secondary | ICD-10-CM | POA: Diagnosis not present

## 2022-01-21 DIAGNOSIS — M25641 Stiffness of right hand, not elsewhere classified: Secondary | ICD-10-CM

## 2022-01-21 DIAGNOSIS — R600 Localized edema: Secondary | ICD-10-CM

## 2022-01-21 NOTE — Therapy (Signed)
[facility] [address] [place], [address] Phone: [phone]   Fax:  [phone]  Occupational Therapy Treatment  Patient Details  Name: Felicia Cantu MRN: [id number] Date of Birth: 12-24-1960 Referring Provider (OT): [place]   Encounter Date: 01/21/2022   OT End of Session - 01/21/22 0817     Visit Number 28    Number of Visits 32    Date for OT Re-Evaluation 02/25/22    OT Start Time 0817    OT Stop Time 0901    OT Time Calculation (min) 44 min    Activity Tolerance Patient tolerated treatment well    Behavior During Therapy [facility] for tasks assessed/performed             Past Medical History:  Diagnosis Date   Breast tenderness    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   Subjective Assessment - 01/21/22 0817     Subjective  I did see the orthopedics and he booked me out for another 4 weeks.  I tried to typed and could not do it all like pain.  Writing still bothers me and if I hit that strip of the scar the wrong way.    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 Yes    Pain Score 2     Pain Location --   wrist and hand   Pain Orientation Right    Pain Descriptors / Indicators Aching;Tightness    Pain Type Acute pain    Pain Onset More than a month ago                  Patient arrived this date with reports of increased pain with attempted to type on the computer with pressure. Done with patient individual finger pressure on small Theraputty balls alternating digits.  As well as dexterity rolling them in little balls.  Using tripod. Also using golf ball for opposition and manipulation from in hand to 3-point pinch out of palm.  Also doing opposition of golf ball or large palm with putty from palm to second third digit back to palm fourth and fifth digit. Rubber band for digit  extension with thumb palmar abduction 2 sets of 10 added         OT Treatments/Exercises (OP) - 01/21/22 0001       RUE Paraffin   Number Minutes Paraffin 8 Minutes    RUE Paraffin Location Hand   wrist   Comments End of session prior to scar massage             Cont with  2 lbs weight for shoulder and elbow in all planes - 12 reps - not to hold weight tight Pt to start back with sup/pro and RD, UD of wrist  12 reps   2 lbs                 Pt report distal end of scar rough spot and tender when touch some objects or hit -  improved since last week- done this date  manual therapy for soft tissue massage to hand, scar massage techniques to palmar surface  over cica scar pad to decrease adhesions and improve tissue mobility and tightness, decrease pain.  DONE with digits and wrist in extention - with scar at stretch Pt continues to use CICA care at night and with scar massage  .   Used mini massager  also on distal end of scar in combination with full extention. But over cica scar pad  Less pain  - 1-2/10  Pt to do same at home     Therapeutic Exercises:   Pt performing table slides  prior to  wrist extension  5 reps - 2 x day -  Cont to do  individually finger flexion alternating and blocking of the fingers.  As well as doing a peace sign, Wolfpack sign, hang loose, showing 2, showing 3, showing 1 or 4.  Facilitating separate tendon glides.   Also patient to  do writing with a regular pen, and large grip patient still over gripping with thumb.  Patient done good with a tripack pencil grip attached to the pen facilitating not over gripping.   Patient to practice writing a few sentences and increased time to like 30 seconds at a time 2-minute with pain less than a 2/10.   Also to doing some typing on computer -    Teal medium putty for gripping, 20 reps to perform 3 times a day.  stop when feeling slight pull - digits only - no thumb engage or wrist flexion  Add 2 point pinch into putty - small pulls - alternate digits           OT Education - 01/21/22 0817     Education Details progress and changes to HEP    Person(s) Educated Patient    Methods Explanation;Demonstration;Tactile cues;Verbal cues;Handout    Comprehension Verbalized understanding;Verbal cues required;Returned demonstration              OT Short Term Goals - 01/07/22 1910       OT SHORT TERM GOAL #1   Title Patient to be independent in home program to decrease hypersensitivity to palmar scar to tolerate using hand in bathing and dressing with pain less than 3/10    Baseline Unable to tolerate soft massage and soft textures more than 20 seconds.  Pain increases to a 5-10/10 with scar massage unable to use towel.  Or use any tools in hand. - NOW pain only at distal end of scar upon touching more than pressure    Time 3    Period Weeks  Status On-going    Target Date 01/28/22               OT Long Term Goals - 01/07/22 1911       OT LONG TERM GOAL #1   Title Patient hypersensitivity in the  right hand improved for patient to be able to use hand normally to grip tools, utensils with pain less than 2/10    Baseline Pain and hypersensitivity over palmar scar increased from 5-10/10.  Tolerate soft massage as well as soft textures less than 20 seconds.  Unable to grip any and hand touch and scar. NOW distal end of scar tender when hit or touch - not able to grip tools or utencils for long time or when touching that area    Time 6    Period Weeks    Status On-going    Target Date 02/18/22      OT LONG TERM GOAL #2   Title Active range of motion in right hand digits improved to within normal limits for composite flexion as well as full extension with no triggering and able to don and doff gloves and put hand in pocket.    Status Achieved      OT LONG TERM GOAL #3   Title Right wrist active range of motion improved to within normal limits in all planes symptom-free.    Status Achieved      OT LONG TERM GOAL #4   Title Right hand grip and prehension strength improved to more than 75% compared to left hand to be able to return to prior level of using hand and yard work, crafts and return back to work.    Baseline Unable to use hand in any gripping or prehension pain increases to 10/10, hypersensitivity with scar stiffness in all digits and triggering.  Strength not tested.  NOW grip and prehension increasing but still painful but less than 4/10    Time 7    Period Weeks    Status On-going    Target Date 02/25/22                   Plan - 01/21/22 0817     Clinical Impression Statement Patient present at OT evaluation  with dog bite to right hand with multiple puncture wounds on 09/17/21 - pt is about 4 months our from injury. Patient made great progress in scar adhesion, tenderness, pain, edema as well as ROM in digits and wrist on R dominant hand.  Scar still tender at distal edge. Patient most pain in composite fist and full extension of digits - in combination of wrist  flexion and extention - pain 2-4/10 with end range and can increase to 6/10 with use.  Pain at times in thumb webspace  and radiating into  thumb thenar eminence and radial wrist.  Last 3 sessions adress functional use of carrrying weight- pain increase over 3-4 lbs - initiate some 2 lbs wrist, elbow and shoulder exercises at home. As well as individual digits exercises, dexterity this date -and cont  writing with tri pot pencil to decrease pinching. Continued progress with right hand, remains limited by  edema in hand and  pain  which can be disperse along hand and forearm.  Continue to work towards decreasing edema, pain, improving ROM and functional use of right UE for daily tasks.   Pt has been on anti-inflammatory since seen by the MD but do not appear to have improvement in edema but pain  did improve. Patient can benefit from skilled OT services to decrease scar tissue, edema and pain with increased motion and strength to return to prior level of function.    OT Occupational Profile and History Problem Focused Assessment - Including review of records relating to presenting problem    Occupational performance deficits (Please refer to evaluation for details): ADL's;IADL's;Work;Play;Leisure;Social Participation    Body Structure / Function / Physical Skills ADL;Strength;Pain;Dexterity;Edema;UE functional use;IADL;ROM;Scar mobility;Flexibility;FMC    Rehab Potential Good    Clinical Decision Making Limited treatment options, no task modification necessary    Comorbidities Affecting Occupational Performance: None    Modification or Assistance to Complete Evaluation  No modification of tasks or assist necessary to complete eval    OT Frequency 2x / week    OT Duration 6 weeks    OT Treatment/Interventions Self-care/ADL training;Fluidtherapy;Contrast Bath;Therapeutic exercise;Iontophoresis;Paraffin;Manual Therapy;Patient/family education;Passive range of motion;Scar mobilization    Consulted and Agree  with Plan of Care Patient             Patient will benefit from skilled therapeutic intervention in order to improve the following deficits and impairments:   Body Structure / Function / Physical Skills: ADL, Strength, Pain, Dexterity, Edema, UE functional use, IADL, ROM, Scar mobility, Flexibility, [facility]       Visit Diagnosis: Pain in right hand  Localized edema  Stiffness of right hand, not elsewhere classified  Scar tissue    Problem List Patient Active Problem List   Diagnosis Date Noted   Status post vaginal hysterectomy 01/29/2015   HLD (hyperlipidemia) 07/18/2013   Clinical depression 07/18/2013   Female genuine stress incontinence 05/23/2013   Incomplete bladder emptying 05/23/2013   Excessive urination at night 05/23/2013    Rosalyn Gess, OTR/L,CLT 01/21/2022, 2:26 PM  [facility] [address] [place], [address] Phone: [phone]   Fax:  [phone]  Name: Felicia Cantu MRN: [id number] Date of Birth: August 04, 1960

## 2022-01-25 ENCOUNTER — Ambulatory Visit: Payer: 59 | Admitting: Occupational Therapy

## 2022-01-25 DIAGNOSIS — R600 Localized edema: Secondary | ICD-10-CM

## 2022-01-25 DIAGNOSIS — M79641 Pain in right hand: Secondary | ICD-10-CM | POA: Diagnosis not present

## 2022-01-25 DIAGNOSIS — M25641 Stiffness of right hand, not elsewhere classified: Secondary | ICD-10-CM

## 2022-01-25 DIAGNOSIS — L905 Scar conditions and fibrosis of skin: Secondary | ICD-10-CM

## 2022-01-25 NOTE — Therapy (Signed)
[facility] [address] [place], [address] Phone: [phone]   Fax:  [phone]  Occupational Therapy Treatment  Patient Details  Name: Felicia Cantu MRN: [id number] Date of Birth: 10-28-1960 Referring Provider (OT): [place]   Encounter Date: 01/25/2022   OT End of Session - 01/25/22 1211     Visit Number 29    Number of Visits 32    Date for OT Re-Evaluation 02/25/22    OT Start Time 1200    OT Stop Time 1238    OT Time Calculation (min) 38 min    Activity Tolerance Patient tolerated treatment well    Behavior During Therapy [facility] for tasks assessed/performed             Past Medical History:  Diagnosis Date   Breast tenderness    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   Subjective Assessment - 01/25/22 1208     Subjective  Scar is doing better- only hit it 2 x - but  I done something yesterday - the 2 lbs weigth one exercise is just pulling up my arm on the side of forearm    Pertinent History Pt was bit by dog on 09/17/21 on R hand - xray negative for fracture- multiple wounds- was on antibiotics since seen at walk in clinic same day - seen by Ortho on 09/23/21 and then again on 10/07/21 she was referred to OT for desentitization, scar management - has trigger fingers 2nd thru 4th - with pain and stiffness -    Patient Stated Goals I want the pain, motion and strength in my  dominant hand better 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 Yes    Pain Score 7     Pain Location --   pull ulnar wrist   Pain Orientation Right    Pain Descriptors / Indicators Aching;Tightness;Sharp    Pain Type Acute pain    Pain Onset In the past 7 days    Pain Frequency Intermittent              Patient arrive with reported increased pain since yesterday with radial ulnar deviation using 2 pound weight.  Pulling and pushing with her thumb changing the gear. Upon further assessment AROM in wrist in all planes within functional limits pain-free. As well as thumb radial abduction. Had some pain with resistance to thumb palmar abduction. No pain with thumb flexion and resistance. Patient tender over proximal forearm flexors and extensors with a trigger point each.           OT Treatments/Exercises (OP) - 01/25/22 0001       RUE  Contrast Bath   Time 8 minutes    Comments hand to forearm prior to soft tissue - decrease pain           Forearm flexors and extensors stretches 10 reps.  Prior to review with patient 2 pound weight exercises for shoulder overhead punches forward punches as well as retraction and elbow flexion and extension. Pain-free.  10 reps.  Can use CMC neoprene if needed. Patient to hold off on rubber band for thumb palmar radial abduction as well as 2 pound weight for wrist in all planes.        Patient to continue with individual tendon glides as well as different finger signs and in hand to fingertips and opposition manipulation of objects.    OT Education - 01/25/22 1211     Education Details progress and changes to HEP    Person(s) Educated Patient    Methods Explanation;Demonstration;Tactile cues;Verbal cues;Handout    Comprehension Verbalized understanding;Verbal cues required;Returned demonstration              OT Short Term Goals - 01/07/22 1910       OT SHORT TERM GOAL #1    Title Patient to be independent in home program to decrease hypersensitivity to palmar scar to tolerate using hand in bathing and dressing with pain less than 3/10    Baseline Unable to tolerate soft massage and soft textures more than 20 seconds.  Pain increases to a 5-10/10 with scar massage unable to use towel.  Or use any tools in hand. - NOW pain only at distal end of scar upon touching more than pressure    Time 3    Period Weeks    Status On-going    Target Date 01/28/22               OT Long Term Goals - 01/07/22 1911       OT LONG TERM GOAL #1   Title Patient hypersensitivity in the right hand improved for patient to be able to use hand normally to grip tools, utensils with pain less than 2/10    Baseline Pain and hypersensitivity over palmar scar increased from 5-10/10.  Tolerate soft massage as well as soft textures less than 20 seconds.  Unable to grip any and hand touch and scar. NOW distal end of scar tender when hit or touch - not able to grip tools or utencils for long time or when touching that area    Time 6    Period Weeks    Status On-going    Target Date 02/18/22      OT LONG TERM GOAL #2   Title Active range of motion in right hand digits improved to within normal limits for composite flexion as well as full extension with no triggering and able to don and doff gloves and put hand in pocket.    Status Achieved      OT LONG TERM GOAL #3   Title Right wrist active range of motion improved to within normal limits in all planes symptom-free.    Status Achieved      OT LONG TERM GOAL #4   Title Right hand grip and prehension strength improved to more than 75% compared to left hand to be able to return to prior level of using hand and yard work, crafts and return back to work.    Baseline Unable to use hand in any gripping or prehension pain increases to 10/10, hypersensitivity with scar stiffness in  all digits and triggering.  Strength not tested.  NOW grip and  prehension increasing but still painful but less than 4/10    Time 7    Period Weeks    Status On-going    Target Date 02/25/22                   Plan - 01/25/22 1211     Clinical Impression Statement Patient present at OT evaluation  with dog bite to right hand with multiple puncture wounds on 09/17/21 - pt is about 4 months our from injury. Patient made great progress in scar adhesion, tenderness, pain, edema as well as ROM in digits and wrist on R dominant hand.  Scar still tender at distal edge. Patient most pain in composite fist and full extension of digits - in combination of wrist flexion and extention - pain 2-4/10 with end range and can increase to 6/10 with use.  Pain at times in thumb webspace  and radiating into  thumb thenar eminence and radial wrist.  Last 3 sessions adress functional use of carrrying weight- pain increase over 3-4 lbs - initiate some 2 lbs wrist, elbow and shoulder exercises at home. As well as individual digits exercises, dexterity this date -and cont  writing with tri pot pencil to decrease pinching. This date pt over done rubber band for thumb PA and RA as well as RD, UD with weight - pt to hold off on wrist and thumb strengthening - pain at scar improved per pt - Continued progress with right hand, remains limited by  edema in hand and  pain  which can be disperse along hand and forearm.  Continue to work towards decreasing edema, pain, improving ROM and functional use of right UE for daily tasks.   Pt has been on anti-inflammatory since seen by the MD but do not appear to have improvement in edema but pain did improve. Patient can benefit from skilled OT services to decrease scar tissue, edema and pain with increased motion and strength to return to prior level of function.    OT Occupational Profile and History Problem Focused Assessment - Including review of records relating to presenting problem    Occupational performance deficits (Please refer to  evaluation for details): ADL's;IADL's;Work;Play;Leisure;Social Participation    Body Structure / Function / Physical Skills ADL;Strength;Pain;Dexterity;Edema;UE functional use;IADL;ROM;Scar mobility;Flexibility;FMC    Rehab Potential Good    Clinical Decision Making Limited treatment options, no task modification necessary    Comorbidities Affecting Occupational Performance: None    Modification or Assistance to Complete Evaluation  No modification of tasks or assist necessary to complete eval    OT Frequency 2x / week    OT Duration 6 weeks    OT Treatment/Interventions Self-care/ADL training;Fluidtherapy;Contrast Bath;Therapeutic exercise;Iontophoresis;Paraffin;Manual Therapy;Patient/family education;Passive range of motion;Scar mobilization    Consulted and Agree with Plan of Care Patient             Patient will benefit from skilled therapeutic intervention in order to improve the following deficits and impairments:   Body Structure / Function / Physical Skills: ADL, Strength, Pain, Dexterity, Edema, UE functional use, IADL, ROM, Scar mobility, Flexibility, [facility]       Visit Diagnosis: Pain in right hand  Stiffness of right hand, not elsewhere classified  Localized edema  Scar tissue    Problem List Patient Active Problem List   Diagnosis Date Noted   Status post vaginal hysterectomy 01/29/2015   HLD (hyperlipidemia) 07/18/2013   Clinical depression  07/18/2013   Female genuine stress incontinence 05/23/2013   Incomplete bladder emptying 05/23/2013   Excessive urination at night 05/23/2013    Rosalyn Gess, OT 01/25/2022, 12:48 PM  [facility] [address] [place], [address] Phone: [phone]   Fax:  [phone]  Name: Felicia Cantu MRN: [id number] Date of Birth: 11-Aug-1960

## 2022-01-28 ENCOUNTER — Ambulatory Visit: Payer: 59 | Admitting: Occupational Therapy

## 2022-01-28 DIAGNOSIS — M79641 Pain in right hand: Secondary | ICD-10-CM | POA: Diagnosis not present

## 2022-01-28 DIAGNOSIS — M25641 Stiffness of right hand, not elsewhere classified: Secondary | ICD-10-CM

## 2022-01-28 DIAGNOSIS — L905 Scar conditions and fibrosis of skin: Secondary | ICD-10-CM

## 2022-01-28 DIAGNOSIS — R600 Localized edema: Secondary | ICD-10-CM

## 2022-01-28 NOTE — Therapy (Signed)
[facility] [address], [place], [address] Phone: [phone]   Fax:  [phone]  Occupational Therapy Treatment/30 th visit  Patient Details  Name: Felicia Cantu MRN: [id number] Date of Birth: 12-15-1960 Referring Provider (OT): [facility] [place]   Encounter Date: 01/28/2022   OT End of Session - 01/28/22 1134     Visit Number 30    Number of Visits 32    Date for OT Re-Evaluation 02/25/22    OT Start Time 0950    OT Stop Time 1033    OT Time Calculation (min) 43 min    Activity Tolerance Patient tolerated treatment well    Behavior During Therapy [facility] for tasks assessed/performed             Past Medical History:  Diagnosis Date   Breast tenderness    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   Subjective Assessment - 01/28/22 1133     Subjective  That sharp pain at scar feeling maybe once day - still tight feeling or pull depending on what I am doing up my arm to in thumb -but better    Pertinent History Pt was bit by dog on 09/17/21 on R hand - xray negative for fracture- multiple wounds- was on antibiotics since seen at walk in clinic same day - seen by Ortho on 09/23/21 and then again on 10/07/21 she was referred to OT for desentitization, scar management - has trigger fingers 2nd thru 4th - with pain and stiffness -    Patient Stated Goals I want the pain, motion and strength in my  dominant hand better 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 Yes    Pain Score 3     Pain Location --   wrist and forearm pull more than pain   Pain Orientation Right    Pain Descriptors / Indicators Aching;Tightness    Pain Frequency Intermittent                [facility] OT Assessment - 01/28/22 0001       Strength   Right Hand Grip (lbs) 35    Right Hand Lateral Pinch 12 lbs    Right Hand 3 Point Pinch 9 lbs   pain thumb   Left Hand Grip (lbs) 65    Left Hand Lateral Pinch 12 lbs    Left Hand 3 Point Pinch 11 lbs                Arrive with decrease pain in wrist , thumb and forearm -as well as less report of hitting distal scar on object causing pain   Decrease tenderness  with scar massage  arrive with increase wrist extention this date - pull over volar scar - tightness      OT Treatments/Exercises (OP) - 01/28/22 0001       RUE Contrast Bath   Time 8 minutes    Comments hand and forearm prior to soft tissue and ROM           Done some graston tool sweeping over volar wrist and forearm - as well as hand  And webspace for thumb and MC spreads  12 reps prior to ROM   Forearm flexors and extensors stretches 10 reps.  Prior to review with patient 2 pound weight exercises for shoulder overhead punches forward punches as well as retraction and elbow flexion and extension. Pain-free.  10 reps.  Can use CMC neoprene if needed. Can start back strengthening for wrist and forearm  As well as gripping putty- add med firm green 1/4 to teal for increase resistance but no prehension           Patient to continue with individual tendon glides as well as different finger signs and in hand to fingertips and opposition manipulation of objects.    Pt going to be out of town for week - will follow up in 2 wks  Cont to practice writing and typing on computer         OT Education - 01/28/22 1134     Education Details  progress and changes to HEP    Person(s) Educated Patient    Methods Explanation;Demonstration;Tactile cues;Verbal cues;Handout    Comprehension Verbalized understanding;Verbal cues required;Returned demonstration              OT Short Term Goals - 01/07/22 1910       OT SHORT TERM GOAL #1   Title Patient to be independent in home program to decrease hypersensitivity to palmar scar to tolerate using hand in bathing and dressing with pain less than 3/10    Baseline Unable to tolerate soft massage and soft textures more than 20 seconds.  Pain increases to a 5-10/10 with scar massage unable to use towel.  Or use any tools in hand. - NOW pain only at distal end of scar upon touching more than pressure    Time 3    Period Weeks    Status On-going    Target Date 01/28/22               OT Long Term Goals - 01/07/22 1911       OT LONG TERM GOAL #1   Title Patient hypersensitivity in the right hand improved for patient to be able to use hand normally to grip tools, utensils with pain less than 2/10    Baseline Pain and hypersensitivity over palmar scar increased from 5-10/10.  Tolerate soft massage as well as soft textures less than 20 seconds.  Unable to grip any and hand touch and scar. NOW distal end of scar tender when hit or touch - not able to grip tools or utencils for long time or when touching that area    Time 6    Period Weeks    Status On-going    Target Date 02/18/22      OT LONG TERM GOAL #2   Title Active range of motion in right hand digits improved to within normal limits for composite flexion as well as full extension with no triggering and able to don and doff gloves and put hand in pocket.  Status Achieved      OT LONG TERM GOAL #3   Title Right wrist active range of motion improved to within normal limits in all planes symptom-free.    Status Achieved      OT LONG TERM GOAL #4   Title Right hand grip and prehension strength improved to more than 75%  compared to left hand to be able to return to prior level of using hand and yard work, crafts and return back to work.    Baseline Unable to use hand in any gripping or prehension pain increases to 10/10, hypersensitivity with scar stiffness in all digits and triggering.  Strength not tested.  NOW grip and prehension increasing but still painful but less than 4/10    Time 7    Period Weeks    Status On-going    Target Date 02/25/22                   Plan - 01/28/22 1148     Clinical Impression Statement Patient present at OT evaluation  with dog bite to right hand with multiple puncture wounds on 09/17/21 - pt is about 4 1/2 months out from injury. Patient made great progress in scar adhesion, tenderness, pain, edema as well as ROM in digits and wrist on R dominant hand.  As well as strength. Patient most pain/pull/ tightness or discomfort  in composite fist and full extension of digits - in combination of wrist flexion and extention - pain 2-4/10 with end range and can increase to 6/10 with use.  Pain at times in thumb webspace  and radiating into  thumb thenar eminence and radial wrist with gripping and pulling.  Last few sessions addressed functional use of carrrying weight,  2 lbs wrist, elbow and shoulder exercises at home. As well as individual digits exercises, dexterity - pt pt practice  writing with tri pot pencil to decrease pinching as well as typing. Pain at distal scar improved the last week or 2. Continued progress with right hand, remains limited by  edema in hand and  pain  which can be disperse along hand and forearm.  Continue to work towards decreasing edema, pain, improving strength and functional use of right UE for daily tasks.   Pt has been on anti-inflammatory since seen by the MD but do not appear to have improvement in edema but pain did improve. Patient can benefit from skilled OT services to decrease scar tissue, edema and pain with increased motion and strength to  return to prior level of function.    OT Occupational Profile and History Problem Focused Assessment - Including review of records relating to presenting problem    Occupational performance deficits (Please refer to evaluation for details): ADL's;IADL's;Work;Play;Leisure;Social Participation    Body Structure / Function / Physical Skills ADL;Strength;Pain;Dexterity;Edema;UE functional use;IADL;ROM;Scar mobility;Flexibility;FMC    Rehab Potential Good    Clinical Decision Making Limited treatment options, no task modification necessary    Comorbidities Affecting Occupational Performance: None    Modification or Assistance to Complete Evaluation  No modification of tasks or assist necessary to complete eval    OT Frequency 2x / week    OT Duration 6 weeks    OT Treatment/Interventions Self-care/ADL training;Fluidtherapy;Contrast Bath;Therapeutic exercise;Iontophoresis;Paraffin;Manual Therapy;Patient/family education;Passive range of motion;Scar mobilization    Consulted and Agree with Plan of Care Patient             Patient will benefit from skilled therapeutic intervention in order to improve the following  deficits and impairments:   Body Structure / Function / Physical Skills: ADL, Strength, Pain, Dexterity, Edema, UE functional use, IADL, ROM, Scar mobility, Flexibility, [facility]       Visit Diagnosis: Pain in right hand  Localized edema  Stiffness of right hand, not elsewhere classified  Scar tissue    Problem List Patient Active Problem List   Diagnosis Date Noted   Status post vaginal hysterectomy 01/29/2015   HLD (hyperlipidemia) 07/18/2013   Clinical depression 07/18/2013   Female genuine stress incontinence 05/23/2013   Incomplete bladder emptying 05/23/2013   Excessive urination at night 05/23/2013    Rosalyn Gess, OTR/L,CLT 01/28/2022, 11:53 AM  [facility] [address] [place],  [address] Phone: [phone]   Fax:  [phone]  Name: ELAIN WIXON MRN: [id number] Date of Birth: [birth year]

## 2022-02-11 ENCOUNTER — Ambulatory Visit: Payer: 59 | Attending: Orthopedic Surgery | Admitting: Occupational Therapy

## 2022-02-11 DIAGNOSIS — R600 Localized edema: Secondary | ICD-10-CM | POA: Insufficient documentation

## 2022-02-11 DIAGNOSIS — M79641 Pain in right hand: Secondary | ICD-10-CM | POA: Diagnosis not present

## 2022-02-11 DIAGNOSIS — L905 Scar conditions and fibrosis of skin: Secondary | ICD-10-CM | POA: Diagnosis not present

## 2022-02-11 DIAGNOSIS — M25641 Stiffness of right hand, not elsewhere classified: Secondary | ICD-10-CM | POA: Insufficient documentation

## 2022-02-11 NOTE — Therapy (Signed)
[facility] [address] [place], [address] Phone: [phone]   Fax:  [phone]  Occupational Therapy Treatment  Patient Details  Name: Felicia Cantu MRN: [id number] Date of Birth: [birth year] Referring Provider (OT): [place]   Encounter Date: 02/11/2022   OT End of Session - 02/11/22 0817     Visit Number 31    Number of Visits 32    Date for OT Re-Evaluation 02/25/22    OT Start Time 0817    OT Stop Time 0902    OT Time Calculation (min) 45 min    Activity Tolerance Patient tolerated treatment well    Behavior During Therapy [facility] for tasks assessed/performed             Past Medical History:  Diagnosis Date   Breast tenderness    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   Subjective Assessment - 02/11/22 0916     Subjective  I was doing great until yesterday when I did change my battery and used this small range.  Since then my thumb has been hurting again that scar pointy area is back. 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 Yes    Pain Score 3     Pain Location --   Thumb radial wrist   Pain Orientation Right    Pain Descriptors / Indicators Tightness;Sore;Tender                [facility] OT Assessment - 02/11/22 0001       AROM   Right Wrist Extension 68 Degrees    Right Wrist Flexion 90 Degrees      Strength   Right Hand Grip (lbs) 40    Right Hand Lateral Pinch 9 lbs   Thumb in pain in volar wrist   Right Hand 3 Point Pinch 8 lbs   pain in thumb to volar wrist   Left Hand Grip (lbs) 65    Left Hand Lateral Pinch 12 lbs    Left Hand 3 Point Pinch 11 lbs  Patient arrived after not being seen for 2 weeks while out of town.  Patient with increased grip strength but prehension decreased with pain in thumb down volar radial wrist. Active range of motion at wrist within normal limits but pain with endrange extension flexion at radial wrist Tenderness over first dorsal compartment as well as positive Finkelstein test. Appear when patient was changing battery in car yesterday using a small range she irritated the first dorsal compartment with some tendinitis symptoms. Distal area of scar tender again and size with mobilization and endrange extension.       OT Treatments/Exercises (OP) - 02/11/22 0001       Ultrasound   Ultrasound Location 1st dorsal compartment R    Ultrasound Parameters 3.3MHZ 20 % , 1.0    Ultrasound Goals Pain      RUE Fluidotherapy   Number Minutes Fluidotherapy 8 Minutes    RUE Fluidotherapy Location Hand;Wrist    Comments increase ROM , decrease stiffness            Pt report distal end of scar rough spot is again tender when touching some objects or hit -   done this date  manual therapy for soft tissue massage to hand, scar massage techniques to palmar surface  to decrease adhesions and  improve tissue mobility and tightness, decrease pain.  DONE with digits and wrist in extention - with scar at stretch Pt continues to use [facility] care at night and with scar massage if needed  Used mini massager and extractor also on distal end of scar in combination with full extention.  Pt to do same at home     Therapeutic Exercises:   Pt to continue table slides  prior to  wrist extension  5 reps - 2 x day -  Cont to do  individually finger flexion alternating and blocking of the fingers.  As well as doing a peace sign, Wolfpack sign, hang loose, showing 2, showing 3, showing 1 or 4.  Facilitating separate tendon glides.   Also patient to  do writing with a regular pen, and large grip patient still over gripping with thumb.  Patient done good with a tripack pencil grip attached to the pen facilitating not over gripping.   Patient to practice writing a few sentences and increased time to like 30 seconds at a time 2-minute with pain less than a 2/10.   Also to doing some typing on computer -    Teal medium putty for gripping, 20 reps to perform 3 times a day.  stop when feeling slight pull - digits only - no thumb engage or wrist flexion  Add 2 point pinch into putty - small pulls - alternate digits And continue 2 pound weight for wrist and upper arm.            OT Education - 02/11/22 0817     Education Details progress and changes to HEP    Person(s) Educated Patient    Methods Explanation;Demonstration;Tactile cues;Verbal cues;Handout    Comprehension Verbalized understanding;Verbal cues required;Returned demonstration              OT Short Term Goals - 01/07/22 1910       OT SHORT TERM GOAL #1   Title Patient to be independent in home program to decrease hypersensitivity to palmar scar to tolerate using hand in bathing and dressing with pain less than 3/10    Baseline Unable to tolerate soft massage and soft textures more than 20  seconds.  Pain increases to a 5-10/10  with scar massage unable to use towel.  Or use any tools in hand. - NOW pain only at distal end of scar upon touching more than pressure    Time 3    Period Weeks    Status On-going    Target Date 01/28/22               OT Long Term Goals - 01/07/22 1911       OT LONG TERM GOAL #1   Title Patient hypersensitivity in the right hand improved for patient to be able to use hand normally to grip tools, utensils with pain less than 2/10    Baseline Pain and hypersensitivity over palmar scar increased from 5-10/10.  Tolerate soft massage as well as soft textures less than 20 seconds.  Unable to grip any and hand touch and scar. NOW distal end of scar tender when hit or touch - not able to grip tools or utencils for long time or when touching that area    Time 6    Period Weeks    Status On-going    Target Date 02/18/22      OT LONG TERM GOAL #2   Title Active range of motion in right hand digits improved to within normal limits for composite flexion as well as full extension with no triggering and able to don and doff gloves and put hand in pocket.    Status Achieved      OT LONG TERM GOAL #3   Title Right wrist active range of motion improved to within normal limits in all planes symptom-free.    Status Achieved      OT LONG TERM GOAL #4   Title Right hand grip and prehension strength improved to more than 75% compared to left hand to be able to return to prior level of using hand and yard work, crafts and return back to work.    Baseline Unable to use hand in any gripping or prehension pain increases to 10/10, hypersensitivity with scar stiffness in all digits and triggering.  Strength not tested.  NOW grip and prehension increasing but still painful but less than 4/10    Time 7    Period Weeks    Status On-going    Target Date 02/25/22                   Plan - 02/11/22 0817     Clinical Impression Statement Patient present at OT evaluation  with dog bite to right hand  with multiple puncture wounds on 09/17/21 - pt is about 4 1/2 months out from injury. Patient made great progress in scar adhesion, tenderness, pain, edema as well as ROM in digits and wrist on R dominant hand.  As well as strength. Pt was out of town for holidays for 2 wks and per pt was doing great until yesterday when she had to change her car battery. This date arrive with increase strenght in wrist and grip - but pain over 1st dorsal compartment and positive [place] again. Pain with RA of thumb and pain radiating in thumb to radial and volar wrist. Cont to have stiffness when making fist. Distal end of scar has pin size scar that is tender again and sharp pain with mobs distally. Patient can benefit from skilled OT services to decrease scar tissue, edema and pain with increased motion and strength to return to prior level of function.  OT Occupational Profile and History Problem Focused Assessment - Including review of records relating to presenting problem    Occupational performance deficits (Please refer to evaluation for details): ADL's;IADL's;Work;Play;Leisure;Social Participation    Body Structure / Function / Physical Skills ADL;Strength;Pain;Dexterity;Edema;UE functional use;IADL;ROM;Scar mobility;Flexibility;FMC    Rehab Potential Good    Clinical Decision Making Limited treatment options, no task modification necessary    Comorbidities Affecting Occupational Performance: None    Modification or Assistance to Complete Evaluation  No modification of tasks or assist necessary to complete eval    OT Frequency 2x / week    OT Duration 2 weeks    OT Treatment/Interventions Self-care/ADL training;Fluidtherapy;Contrast Bath;Therapeutic exercise;Iontophoresis;Paraffin;Manual Therapy;Patient/family education;Passive range of motion;Scar mobilization    Consulted and Agree with Plan of Care Patient             Patient will benefit from skilled therapeutic intervention in order to improve  the following deficits and impairments:   Body Structure / Function / Physical Skills: ADL, Strength, Pain, Dexterity, Edema, UE functional use, IADL, ROM, Scar mobility, Flexibility, [facility]       Visit Diagnosis: Pain in right hand  Localized edema  Stiffness of right hand, not elsewhere classified  Scar tissue    Problem List Patient Active Problem List   Diagnosis Date Noted   Status post vaginal hysterectomy 01/29/2015   HLD (hyperlipidemia) 07/18/2013   Clinical depression 07/18/2013   Female genuine stress incontinence 05/23/2013   Incomplete bladder emptying 05/23/2013   Excessive urination at night 05/23/2013    Rosalyn Gess, OTR/L,CLT 02/11/2022, 9:23 AM  [facility] [address], [place], [address] Phone: [phone]   Fax:  [phone]  Name: ZIAH TURVEY MRN: [id number] Date of Birth: 1960-11-11

## 2022-02-18 ENCOUNTER — Encounter: Payer: Self-pay | Admitting: Occupational Therapy

## 2022-02-18 ENCOUNTER — Ambulatory Visit: Payer: 59 | Admitting: Occupational Therapy

## 2022-02-18 DIAGNOSIS — R600 Localized edema: Secondary | ICD-10-CM | POA: Diagnosis not present

## 2022-02-18 DIAGNOSIS — L905 Scar conditions and fibrosis of skin: Secondary | ICD-10-CM

## 2022-02-18 DIAGNOSIS — M25641 Stiffness of right hand, not elsewhere classified: Secondary | ICD-10-CM | POA: Diagnosis not present

## 2022-02-18 DIAGNOSIS — M79641 Pain in right hand: Secondary | ICD-10-CM | POA: Diagnosis not present

## 2022-02-18 NOTE — Therapy (Signed)
[facility] [address], [place], [address] Phone: [phone]   Fax:  [phone]  Occupational Therapy Treatment  Patient Details  Name: Felicia Cantu MRN: [id number] Date of Birth: [birth year] Referring Provider (OT): [place]   Encounter Date: 02/18/2022   OT End of Session - 02/18/22 0943     Visit Number 32    Number of Visits 33    Date for OT Re-Evaluation 02/25/22    OT Start Time 0911    OT Stop Time 0950    OT Time Calculation (min) 39 min    Activity Tolerance Patient tolerated treatment well    Behavior During Therapy [facility] for tasks assessed/performed             Past Medical History:  Diagnosis Date   Breast tenderness    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   Subjective Assessment - 02/18/22 0937     Subjective  Pointy area on end of scar so tender again - touching wrong or pressure one way. Pain with trying to use my R hand - pain in wrist, hand, thumb and up to elbow - doing better picking up larger object than tiny objects- still have swelling in my hand even with the medication    Pertinent History Pt was bit by dog on 09/17/21 on R hand - xray negative for fracture- multiple wounds- was on antibiotics since seen at walk in clinic same day - seen by Ortho on 09/23/21 and then again on 10/07/21 she was referred to OT for desentitization, scar management - has  trigger fingers 2nd thru 4th - with pain and stiffness -    Patient Stated Goals I want the pain, motion and strength in my dominant hand better 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 Yes    Pain Score 6     Pain Location --   thumb, palm , wrist to elbow   Pain Orientation Right    Pain Descriptors / Indicators Tender;Tightness;Sharp;Aching    Pain Type Acute pain;Chronic pain    Pain Onset More than a month ago                [facility] OT Assessment - 02/18/22 0001       AROM   Right Wrist Extension 65 Degrees      Strength   Right Hand Grip (lbs) 40   pain   Right Hand Lateral Pinch 10 lbs   pain   Right Hand 3 Point Pinch 8 lbs   pain   Left Hand Grip (lbs) 65    Left Hand Lateral Pinch 12 lbs  Left Hand 3 Point Pinch 11 lbs             Patient arrives with reports of continues increased pain with use of right hand in functional task at home.  Patient reports doing better grasping a larger object than a precision pinch. Pain appear radiating and moving around from thumb to wrist, to radial wrist, to forearm to even into the bicep. Patient continues to be positive for [facility] and tenderness over distal radius since last week. No triggering of digits but continues to have inflammation and edema in the hand.          OT Treatments/Exercises (OP) - 02/18/22 0001       RUE Contrast Bath   Time 8 minutes    Comments Hand and forearm prior to soft tissue            Patient to cont doing contrast 2-3 times a day to right hand and wrist.  Followed with scar massage.    Focus this date at distal end of scar ( pin size)- scar mobs manually, [place]aston and mini massager - scar mobs with digits extention  Cica -Care scar pad at nighttime under Isotoner glove  Appear 3rd digit more than 4th  scar adhesion  to tender - pain still limiting her -and pt compensating - causing pain thumb, wrist , forearm and even bicep    Tendon glides with lumbrical fist followed by blocked intrinsic fist in composite fist  to palm  Individual digits extnetion , flexion and signs  Cannot write yet - increase pain      OT Education - 02/18/22 0942     Education Details progress and changes to HEP    Person(s) Educated Patient    Methods Explanation;Demonstration;Tactile cues;Verbal cues;Handout    Comprehension Verbalized understanding;Verbal cues required;Returned demonstration              OT Short Term Goals - 01/07/22 1910       OT SHORT TERM GOAL #1   Title Patient to be independent in home program to decrease hypersensitivity to palmar scar to tolerate using hand in bathing and dressing with pain less than 3/10    Baseline Unable to tolerate soft massage and soft textures more than 20 seconds.  Pain increases to a 5-10/10 with scar massage unable to use towel.  Or use any tools in hand. - NOW pain only at distal end of scar upon touching more than pressure    Time 3    Period Weeks    Status On-going    Target Date 01/28/22               OT Long Term Goals - 01/07/22 1911       OT LONG TERM GOAL #1   Title Patient hypersensitivity in the right hand improved for patient to be able to use hand normally to grip tools, utensils with pain less than 2/10    Baseline Pain and hypersensitivity over palmar scar increased from 5-10/10.  Tolerate soft massage as well as soft textures less than 20 seconds.  Unable to grip any and hand touch and scar. NOW distal end of scar tender when hit or touch - not able to grip tools or utencils for long time or when touching that area    Time 6    Period Weeks    Status On-going    Target Date 02/18/22      OT LONG TERM GOAL #2  Title Active range of motion in right hand digits improved to within normal limits for composite flexion as well as full extension with no triggering and able to don and doff gloves and put hand in pocket.    Status Achieved      OT LONG  TERM GOAL #3   Title Right wrist active range of motion improved to within normal limits in all planes symptom-free.    Status Achieved      OT LONG TERM GOAL #4   Title Right hand grip and prehension strength improved to more than 75% compared to left hand to be able to return to prior level of using hand and yard work, crafts and return back to work.    Baseline Unable to use hand in any gripping or prehension pain increases to 10/10, hypersensitivity with scar stiffness in all digits and triggering.  Strength not tested.  NOW grip and prehension increasing but still painful but less than 4/10    Time 7    Period Weeks    Status On-going    Target Date 02/25/22                   Plan - 02/18/22 0944     Clinical Impression Statement Patient present at OT evaluation  with dog bite to right hand with multiple puncture wounds on 09/17/21 - pt is about  5 months out from injury. Patient made great progress in scar adhesion, tenderness, pain, edema as well as ROM in digits and wrist as well as strength in R dominant hand/wrist. Pt was out of town for holidays - per pt was doing better until 2 wks ago when she had to change her car battery. Pt cont to have pain radiaing and moving from hand, into thumb/wrist - in forearm and even into bicep. Grip and prehension increased but pain cont to be limiting her mostly. Cannot advance strength - AROM WNL . Pain over 1st dorsal compartment and positive Finkelstein.  No triggering but pain with 3rd and 4th digits extention with mobilization of distal end of scar- and with precision pinch. Discuss pt case with Reche Dixon PA - request if possible MRI. Pt decrease to 1 x wk - await follow up from MD visit Tuesday.    OT Occupational Profile and History Problem Focused Assessment - Including review of records relating to presenting problem    Occupational performance deficits (Please refer to evaluation for details): ADL's;IADL's;Work;Play;Leisure;Social  Participation    Body Structure / Function / Physical Skills ADL;Strength;Pain;Dexterity;Edema;UE functional use;IADL;ROM;Scar mobility;Flexibility;FMC    Rehab Potential Good    Clinical Decision Making Limited treatment options, no task modification necessary    Comorbidities Affecting Occupational Performance: None    Modification or Assistance to Complete Evaluation  No modification of tasks or assist necessary to complete eval    OT Frequency 1x / week    OT Duration --   1 wk   OT Treatment/Interventions Self-care/ADL training;Fluidtherapy;Contrast Bath;Therapeutic exercise;Iontophoresis;Paraffin;Manual Therapy;Patient/family education;Passive range of motion;Scar mobilization    Consulted and Agree with Plan of Care Patient             Patient will benefit from skilled therapeutic intervention in order to improve the following deficits and impairments:   Body Structure / Function / Physical Skills: ADL, Strength, Pain, Dexterity, Edema, UE functional use, IADL, ROM, Scar mobility, Flexibility, FMC       Visit Diagnosis: Pain in right hand  Localized edema  Stiffness of right hand,  not elsewhere classified  Scar tissue    Problem List Patient Active Problem List   Diagnosis Date Noted   Status post vaginal hysterectomy 01/29/2015   HLD (hyperlipidemia) 07/18/2013   Clinical depression 07/18/2013   Female genuine stress incontinence 05/23/2013   Incomplete bladder emptying 05/23/2013   Excessive urination at night 05/23/2013    Rosalyn Gess, OTR/L,CLT 02/18/2022, 11:54 AM  [facility] [address], [place], [address] Phone: [phone]   Fax:  [phone]  Name: ASYRIA KOLANDER MRN: [id number] Date of Birth: 09-08-1960

## 2022-02-25 ENCOUNTER — Ambulatory Visit: Payer: 59 | Admitting: Occupational Therapy

## 2022-03-02 DIAGNOSIS — W540XXA Bitten by dog, initial encounter: Secondary | ICD-10-CM | POA: Diagnosis not present

## 2022-03-02 DIAGNOSIS — S61252D Open bite of right middle finger without damage to nail, subsequent encounter: Secondary | ICD-10-CM | POA: Diagnosis not present

## 2022-03-02 DIAGNOSIS — M65331 Trigger finger, right middle finger: Secondary | ICD-10-CM | POA: Diagnosis not present

## 2022-03-02 DIAGNOSIS — E119 Type 2 diabetes mellitus without complications: Secondary | ICD-10-CM | POA: Diagnosis not present

## 2022-03-02 DIAGNOSIS — Z6830 Body mass index (BMI) 30.0-30.9, adult: Secondary | ICD-10-CM | POA: Diagnosis not present

## 2022-03-03 ENCOUNTER — Other Ambulatory Visit: Payer: Self-pay | Admitting: Orthopedic Surgery

## 2022-03-03 ENCOUNTER — Ambulatory Visit: Payer: 59 | Admitting: Occupational Therapy

## 2022-03-03 DIAGNOSIS — R600 Localized edema: Secondary | ICD-10-CM

## 2022-03-03 DIAGNOSIS — M79641 Pain in right hand: Secondary | ICD-10-CM

## 2022-03-03 DIAGNOSIS — L905 Scar conditions and fibrosis of skin: Secondary | ICD-10-CM | POA: Diagnosis not present

## 2022-03-03 DIAGNOSIS — M25641 Stiffness of right hand, not elsewhere classified: Secondary | ICD-10-CM

## 2022-03-03 DIAGNOSIS — W540XXA Bitten by dog, initial encounter: Secondary | ICD-10-CM

## 2022-03-03 NOTE — Therapy (Signed)
[facility] [address] [place], [address] Phone: [phone]   Fax:  [phone]  Occupational Therapy Treatment  Patient Details  Name: Felicia Cantu MRN: [id number] Date of Birth: 1960-03-15 Referring Provider (OT): [place]   Encounter Date: 03/03/2022   OT End of Session - 03/03/22 1524     Visit Number 33    Number of Visits 40    Date for OT Re-Evaluation 04/14/22    OT Start Time 1517    OT Stop Time 1435    OT Time Calculation (min) 32 min    Activity Tolerance Patient tolerated treatment well    Behavior During Therapy [facility] for tasks assessed/performed             Past Medical History:  Diagnosis Date   Breast tenderness    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   Subjective Assessment - 03/03/22 1523     Subjective  I did see the doctor yesterday.  They ordered an MRI.  I worked that scar but there is this once sharp hard area that if I touched wrong which is painful.  And I am trying to use my hand and picking things up and gripping but at the base of the thumb it still hurts and my hand.  I have done the exercises like you told me.    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 Yes    Pain Score 3     Pain Location --   Radial wrist /1st dorsal compartment   Pain Orientation Right    Pain Descriptors / Indicators Tender;Aching    Pain Type Acute pain;Chronic pain                [facility] OT Assessment - 03/03/22 0001       AROM   Right Wrist Extension 70 Degrees    Right Wrist Flexion 90 Degrees      Strength   Right Hand Grip (lbs) 35    Right Hand Lateral Pinch 10 lbs    Right Hand 3 Point Pinch 9 lbs    Left Hand Grip (lbs) 55    Left Hand  Lateral Pinch 16 lbs    Left Hand 3 Point Pinch 11 lbs                  Patient arrive after doing home program for 2 weeks.  Did see MD yesterday and order MRI. Patient wrist and thumb active range of motion within normal limits. Continue to have some edema over the metacarpals as well as with intrinsic fist. Continues to have 3/10 tenderness over the first dorsal compartment with a positive Wynn Maudlin. Strength in wrist in all planes 4+/5. Pain stayed under 3/10 with most activities today and range of motion. Was able to maintain grip and prehension strength.  Was able to push and pull heavy door but with some discomfort in the palm. Can carry and lift 8 pounds some strain in upper arm.  Can carry 10 pounds with a slight pull in the palm and wrist. Patient has great improvement in scar tissue but 1 area on the distal scar is more pronounced the last few weeks in size very tender.  If touching or with hyperextension of third digit have increased pain. Patient continue or to be limited in persistent pinch, 3-point pinch typing, writing gripping or doing repetitive task.  Limited in opening jars, pulling with laundry and changing  bed. Pulling up hands or pushing down discomfort. Patient to follow-up in 2 weeks continue with home program. Await results from MRI.            OT Education - 03/03/22 1524     Education Details progress and HEP    Person(s) Educated Patient    Methods Explanation;Demonstration;Tactile cues;Verbal cues;Handout    Comprehension Verbalized understanding;Verbal cues required;Returned demonstration              OT Short Term Goals - 03/03/22 1530       OT SHORT TERM GOAL #1   Title Patient to be independent in home program to decrease hypersensitivity to palmar scar to tolerate using hand in bathing and dressing with pain less than 3/10    Status Achieved               OT Long Term Goals - 03/03/22 1530       OT LONG TERM GOAL #1   Title Patient hypersensitivity in the right hand improved for patient to be able to use hand normally to grip tools, utensils with pain less than 2/10    Baseline Pain and hypersensitivity over palmar scar increased from 5-10/10.  Tolerate soft massage as well as soft textures less than 20 seconds.  Unable to grip any and hand touch and scar. NOW distal end of scar tender when hit or touch - not able to grip tools or utencils for long time or when touching that area ( pin size area pronounce)    Time 6    Period Weeks    Status On-going    Target Date 04/08/22      OT LONG TERM GOAL #2   Title Active range of motion in right hand digits improved to within normal limits for composite flexion as well as full extension with no triggering and able to don and doff gloves and put hand in pocket.    Status Achieved      OT LONG TERM GOAL #3   Title Right wrist active range of motion improved to within normal limits in all planes symptom-free.    Status Achieved      OT LONG TERM GOAL #4  Title Right hand grip and prehension strength improved to more than 75% compared to left hand to be able to return to prior level of using hand and yard  work, crafts and return back to work.    Baseline Unable to use hand in any gripping or prehension pain increases to 10/10, hypersensitivity with scar stiffness in all digits and triggering.  Strength not tested.  NOW grip and prehension increasing but still painful  when gripping but less than 4/10    Time 6    Period Weeks    Status On-going    Target Date 04/08/22                   Plan - 03/03/22 1525     Clinical Impression Statement Patient present at OT evaluation  with dog bite to right hand with multiple puncture wounds on 09/17/21 - pt is about  5 1/2 months out from injury. Patient made great progress in scar adhesion, tenderness, pain, edema as well as ROM in digits and wrist as well as strength in R dominant hand/wrist.  Pt cont to have pain radiating and moving from hand, into thumb/wrist - in forearm and even into bicep area at times depending on what she does. Grip and prehension increased but pain cont to be limiting her mostly. Cannot advance strength - AROM WNL . Pain over 1st dorsal compartment and positive Finkelstein. As well as tenderness over CMC of thumb with some athopy at thumb and decrease PA of thumb.  No triggering but pain with 3rd   digit extention with mobilization of distal end of scar- and with precision or 3 point pinch. Pt has sharp pin size area on distal scar that is tender and more pronounce the last few wks. Discuss pt case with Reche Dixon PA and MRI was ordered - pt to follow up with me in 2 wks - but also waiting on results from MRI.    OT Occupational Profile and History Problem Focused Assessment - Including review of records relating to presenting problem    Occupational performance deficits (Please refer to evaluation for details): ADL's;IADL's;Work;Play;Leisure;Social Participation    Body Structure / Function / Physical Skills ADL;Strength;Pain;Dexterity;Edema;UE functional use;IADL;ROM;Scar mobility;Flexibility;FMC    Rehab Potential Good     Clinical Decision Making Limited treatment options, no task modification necessary    Comorbidities Affecting Occupational Performance: None    Modification or Assistance to Complete Evaluation  No modification of tasks or assist necessary to complete eval    OT Frequency Biweekly    OT Duration 6 weeks    OT Treatment/Interventions Self-care/ADL training;Fluidtherapy;Contrast Bath;Therapeutic exercise;Iontophoresis;Paraffin;Manual Therapy;Patient/family education;Passive range of motion;Scar mobilization    Consulted and Agree with Plan of Care Patient             Patient will benefit from skilled therapeutic intervention in order to improve the following deficits and impairments:   Body Structure / Function / Physical Skills: ADL, Strength, Pain, Dexterity, Edema, UE functional use, IADL, ROM, Scar mobility, Flexibility, [facility]       Visit Diagnosis: Pain in right hand  Localized edema  Stiffness of right hand, not elsewhere classified  Scar tissue    Problem List Patient Active Problem List   Diagnosis Date Noted   Status post vaginal hysterectomy 01/29/2015   HLD (hyperlipidemia) 07/18/2013   Clinical depression 07/18/2013   Female genuine stress incontinence 05/23/2013   Incomplete bladder emptying 05/23/2013   Excessive urination at night 05/23/2013  Rosalyn Gess, OTR/L,CLT 03/03/2022, 5:32 PM  [facility] [address] [place], [address] Phone: [phone]   Fax:  [phone]  Name: Felicia Cantu MRN: [id number] Date of Birth: September 26, 1960

## 2022-03-09 ENCOUNTER — Ambulatory Visit
Admission: RE | Admit: 2022-03-09 | Discharge: 2022-03-09 | Disposition: A | Discharge status: Home / Self Care | Payer: 59 | Source: Ambulatory Visit | Attending: Orthopedic Surgery | Admitting: Orthopedic Surgery

## 2022-03-09 DIAGNOSIS — M19041 Primary osteoarthritis, right hand: Secondary | ICD-10-CM | POA: Diagnosis not present

## 2022-03-09 DIAGNOSIS — M25441 Effusion, right hand: Secondary | ICD-10-CM | POA: Diagnosis not present

## 2022-03-09 DIAGNOSIS — W540XXA Bitten by dog, initial encounter: Secondary | ICD-10-CM

## 2022-03-18 ENCOUNTER — Ambulatory Visit: Payer: 59 | Attending: Orthopedic Surgery | Admitting: Occupational Therapy

## 2022-03-18 DIAGNOSIS — L905 Scar conditions and fibrosis of skin: Secondary | ICD-10-CM | POA: Insufficient documentation

## 2022-03-18 DIAGNOSIS — R600 Localized edema: Secondary | ICD-10-CM | POA: Insufficient documentation

## 2022-03-18 DIAGNOSIS — M79641 Pain in right hand: Secondary | ICD-10-CM | POA: Insufficient documentation

## 2022-03-18 DIAGNOSIS — M25641 Stiffness of right hand, not elsewhere classified: Secondary | ICD-10-CM | POA: Insufficient documentation

## 2022-03-18 NOTE — Therapy (Signed)
[facility] [address] [place], [address] Phone: [phone]   Fax:  [phone]  Occupational Therapy Treatment  Patient Details  Name: Felicia Cantu MRN: [id number] Date of Birth: 02/13/1960 Referring Provider (OT): [place]   Encounter Date: 03/18/2022   OT End of Session - 03/18/22 1826     Visit Number 34    Number of Visits 40    Date for OT Re-Evaluation 04/14/22    OT Start Time 1130    OT Stop Time 1210    OT Time Calculation (min) 40 min    Activity Tolerance Patient tolerated treatment well    Behavior During Therapy [facility] for tasks assessed/performed             Past Medical History:  Diagnosis Date   Breast tenderness    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   Subjective Assessment - 03/18/22 1824     Subjective  I had the MRI -did not sound like it really showed anything in the palm or middle finger.  It showed some arthritis changes.  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 Yes    Pain Score 3     Pain Location --   Thumb , middle finger   Pain Orientation Right    Pain Descriptors / Indicators Aching;Tingling    Pain Type Chronic pain;Acute pain                [facility] OT Assessment - 03/18/22 0001       AROM   Right Wrist Extension 70 Degrees    Right Wrist Flexion 90 Degrees      Strength   Right Hand Grip (lbs) 30   pain 3rd digit   Right Hand Lateral Pinch 12 lbs   pain at thumb MC   Right Hand 3 Point Pinch 9 lbs   pain thumb   Left Hand Grip (lbs) 65    Left Hand Lateral Pinch 15 lbs  Patient arrive after doing home program for 2 weeks.  Did see MD and had MRI. Patient wrist and thumb active range of motion within normal limits. Continue to have some edema over the metacarpals as well as with intrinsic fist. Continues to have 3/10 tenderness over the first dorsal compartment with a positive Wynn Maudlin. But pain mostly in thumb IP, to [facility] to [facility] with any gripping , carrying or pinching ?   Strength in wrist in all planes 4+/5. Pain stayed under 3/10 with most activities today and range of motion. Was able to push and pull heavy door but with some discomfort in the palm. Can carry and lift 8 pounds some strain - could carry 10 pounds but with pain in hand and thumb  Patient has great improvement in scar tissue but 1 area on the distal scar is more pronounced the last few weeks in size very tender.  If touching or with hyperextension of third digit have increased pain. Per pt she removed object the last few wks from scar - white triangle object -but something still in there per pt   Patient continue or to be limited in persistent  pain with pinch, 3-point pinch, typing, writing, gripping or doing repetitive task.  Limited in  opening jars, pulling with laundry and changing bed. Pt to follow up with Orthopedics again                OT Education - 03/18/22 1826     Education Details progress and HEP    Person(s) Educated Patient    Comprehension Verbalized understanding;Verbal cues required;Returned demonstration              OT Short Term Goals - 03/03/22 1530       OT SHORT TERM GOAL #1   Title Patient to be independent in home program to decrease hypersensitivity to palmar scar to tolerate using hand in bathing and dressing with pain less than 3/10    Status Achieved               OT Long Term Goals - 03/03/22 1530       OT LONG TERM GOAL #1   Title Patient hypersensitivity in the right hand improved for patient to be able to use hand normally to grip tools, utensils with pain less than 2/10    Baseline Pain and hypersensitivity over palmar scar increased from 5-10/10.  Tolerate soft massage as well as soft textures less than 20 seconds.  Unable to grip any and hand touch and scar. NOW distal end of scar tender when hit or touch - not able to grip tools or utencils for long time or when touching that area ( pin size area pronounce)    Time 6    Period Weeks    Status On-going    Target Date 04/08/22      OT LONG TERM GOAL #2   Title Active range of motion in right hand digits improved to within normal limits for composite flexion as well as full extension with no triggering and able to don and doff gloves and put hand in pocket.    Status Achieved      OT LONG TERM GOAL #3   Title Right wrist active range of motion improved to within normal limits in all planes symptom-free.    Status Achieved      OT LONG TERM GOAL #4   Title Right hand grip and prehension strength improved to more than 75% compared  to left hand to be able to return to prior level of using hand and yard work, crafts and return back to work.    Baseline Unable to use hand in any gripping or prehension pain  increases to 10/10, hypersensitivity with scar stiffness in all digits and triggering.  Strength not tested.  NOW grip and prehension increasing but still painful  when gripping but less than 4/10    Time 6    Period Weeks    Status On-going    Target Date 04/08/22                   Plan - 03/18/22 1827     Clinical Impression Statement Patient present at OT evaluation  with dog bite to right hand with multiple puncture wounds on 09/17/21 - pt is about  6 months out from injury. Patient made great progress in scar adhesion, tenderness, pain, edema as well as ROM in digits and wrist as well as strength in R dominant hand/wrist.  Pt cont to have pain radiating and moving from palm to 3rd digit , as well as thumb - and at times in wrist and forearm.  Pt cont to be limited by pain more than ROM and strength. MRI appear did not show any issues at palm or 3rd digit or thumb?. Cannot advance strength or functional strength.  AROM WNL . Pain over 1st dorsal compartment and positive Finkelstein but pain mostly in thumb IP , to [facility] and [facility] - and 3rd digit PIP from palmar scar. Pt has sharp pin size area on distal scar that is still  tender and more pronounce  even after she took something out of it per pt few wks ago. Pt to follow up with Orthopedics again - don't know if can have Dr Candelaria Stagers do ultrasound to palmar scar and thumb - for further assessment - progress in therapy limited by continues pain    OT Occupational Profile and History Problem Focused Assessment - Including review of records relating to presenting problem    Occupational performance deficits (Please refer to evaluation for details): ADL's;IADL's;Work;Play;Leisure;Social Participation    Body Structure / Function / Physical Skills ADL;Strength;Pain;Dexterity;Edema;UE functional use;IADL;ROM;Scar mobility;Flexibility;FMC    Rehab Potential Good    Clinical Decision Making Limited treatment options, no task modification necessary     Comorbidities Affecting Occupational Performance: None    Modification or Assistance to Complete Evaluation  No modification of tasks or assist necessary to complete eval    OT Frequency Biweekly    OT Duration 4 weeks    OT Treatment/Interventions Self-care/ADL training;Fluidtherapy;Contrast Bath;Therapeutic exercise;Iontophoresis;Paraffin;Manual Therapy;Patient/family education;Passive range of motion;Scar mobilization    Consulted and Agree with Plan of Care Patient             Patient will benefit from skilled therapeutic intervention in order to improve the following deficits and impairments:   Body Structure / Function / Physical Skills: ADL, Strength, Pain, Dexterity, Edema, UE functional use, IADL, ROM, Scar mobility, Flexibility, [facility]       Visit Diagnosis: Pain in right hand  Localized edema  Stiffness of right hand, not elsewhere classified  Scar tissue    Problem List Patient Active Problem List   Diagnosis Date Noted   Status post vaginal hysterectomy 01/29/2015   HLD (hyperlipidemia) 07/18/2013   Clinical depression 07/18/2013   Female genuine stress incontinence 05/23/2013   Incomplete bladder emptying 05/23/2013   Excessive urination at night 05/23/2013    Rosalyn Gess, OTR/L,CLT 03/18/2022,  6:32 PM  [facility] [address] [place], [address] Phone: [phone]   Fax:  [phone]  Name: Felicia Cantu MRN: [id number] Date of Birth: [birth year]

## 2022-03-30 DIAGNOSIS — S61254A Open bite of right ring finger without damage to nail, initial encounter: Secondary | ICD-10-CM | POA: Diagnosis not present

## 2022-03-30 DIAGNOSIS — W540XXA Bitten by dog, initial encounter: Secondary | ICD-10-CM | POA: Diagnosis not present

## 2022-03-30 DIAGNOSIS — Z6830 Body mass index (BMI) 30.0-30.9, adult: Secondary | ICD-10-CM | POA: Diagnosis not present

## 2022-03-30 DIAGNOSIS — S61250A Open bite of right index finger without damage to nail, initial encounter: Secondary | ICD-10-CM | POA: Diagnosis not present

## 2022-03-30 DIAGNOSIS — E119 Type 2 diabetes mellitus without complications: Secondary | ICD-10-CM | POA: Diagnosis not present

## 2022-03-30 DIAGNOSIS — S61252A Open bite of right middle finger without damage to nail, initial encounter: Secondary | ICD-10-CM | POA: Diagnosis not present

## 2022-04-05 DIAGNOSIS — S61259A Open bite of unspecified finger without damage to nail, initial encounter: Secondary | ICD-10-CM | POA: Diagnosis not present

## 2022-04-05 DIAGNOSIS — M795 Residual foreign body in soft tissue: Secondary | ICD-10-CM | POA: Diagnosis not present

## 2022-04-05 DIAGNOSIS — W540XXA Bitten by dog, initial encounter: Secondary | ICD-10-CM | POA: Diagnosis not present

## 2022-04-15 ENCOUNTER — Encounter: Payer: 59 | Admitting: Occupational Therapy

## 2022-04-27 DIAGNOSIS — S76122D Laceration of left quadriceps muscle, fascia and tendon, subsequent encounter: Secondary | ICD-10-CM | POA: Diagnosis not present

## 2022-04-27 DIAGNOSIS — W540XXA Bitten by dog, initial encounter: Secondary | ICD-10-CM | POA: Diagnosis not present

## 2022-04-27 DIAGNOSIS — E119 Type 2 diabetes mellitus without complications: Secondary | ICD-10-CM | POA: Diagnosis not present

## 2022-04-27 DIAGNOSIS — S61259A Open bite of unspecified finger without damage to nail, initial encounter: Secondary | ICD-10-CM | POA: Diagnosis not present

## 2022-04-27 DIAGNOSIS — Z6830 Body mass index (BMI) 30.0-30.9, adult: Secondary | ICD-10-CM | POA: Diagnosis not present

## 2022-04-27 DIAGNOSIS — M795 Residual foreign body in soft tissue: Secondary | ICD-10-CM | POA: Diagnosis not present

## 2022-05-06 DIAGNOSIS — E039 Hypothyroidism, unspecified: Secondary | ICD-10-CM | POA: Diagnosis not present

## 2022-05-06 DIAGNOSIS — E7800 Pure hypercholesterolemia, unspecified: Secondary | ICD-10-CM | POA: Diagnosis not present

## 2022-05-06 DIAGNOSIS — F325 Major depressive disorder, single episode, in full remission: Secondary | ICD-10-CM | POA: Diagnosis not present

## 2022-05-06 DIAGNOSIS — E119 Type 2 diabetes mellitus without complications: Secondary | ICD-10-CM | POA: Diagnosis not present

## 2022-05-06 DIAGNOSIS — F5104 Psychophysiologic insomnia: Secondary | ICD-10-CM | POA: Diagnosis not present

## 2022-06-22 DIAGNOSIS — S61202D Unspecified open wound of right middle finger without damage to nail, subsequent encounter: Secondary | ICD-10-CM | POA: Diagnosis not present

## 2022-06-22 DIAGNOSIS — Z6833 Body mass index (BMI) 33.0-33.9, adult: Secondary | ICD-10-CM | POA: Diagnosis not present

## 2022-06-22 DIAGNOSIS — W540XXA Bitten by dog, initial encounter: Secondary | ICD-10-CM | POA: Diagnosis not present

## 2022-06-22 DIAGNOSIS — S76122D Laceration of left quadriceps muscle, fascia and tendon, subsequent encounter: Secondary | ICD-10-CM | POA: Diagnosis not present

## 2022-06-22 DIAGNOSIS — M795 Residual foreign body in soft tissue: Secondary | ICD-10-CM | POA: Diagnosis not present

## 2022-06-22 DIAGNOSIS — S61204D Unspecified open wound of right ring finger without damage to nail, subsequent encounter: Secondary | ICD-10-CM | POA: Diagnosis not present

## 2022-06-22 DIAGNOSIS — S61200D Unspecified open wound of right index finger without damage to nail, subsequent encounter: Secondary | ICD-10-CM | POA: Diagnosis not present

## 2022-06-22 DIAGNOSIS — E119 Type 2 diabetes mellitus without complications: Secondary | ICD-10-CM | POA: Diagnosis not present

## 2022-08-17 DIAGNOSIS — Z6833 Body mass index (BMI) 33.0-33.9, adult: Secondary | ICD-10-CM | POA: Diagnosis not present

## 2022-08-17 DIAGNOSIS — S61259A Open bite of unspecified finger without damage to nail, initial encounter: Secondary | ICD-10-CM | POA: Diagnosis not present

## 2022-08-17 DIAGNOSIS — W540XXA Bitten by dog, initial encounter: Secondary | ICD-10-CM | POA: Diagnosis not present

## 2022-08-17 DIAGNOSIS — S8391XA Sprain of unspecified site of right knee, initial encounter: Secondary | ICD-10-CM | POA: Diagnosis not present

## 2022-08-17 DIAGNOSIS — E119 Type 2 diabetes mellitus without complications: Secondary | ICD-10-CM | POA: Diagnosis not present

## 2022-08-24 IMAGING — MG MM DIGITAL SCREENING BILAT W/ TOMO AND CAD
8 series · 8 of 24 positions shown · non-contrast
Comparison: Previous exam(s).

CLINICAL DATA: Screening.

EXAM:
DIGITAL SCREENING BILATERAL MAMMOGRAM WITH TOMOSYNTHESIS AND CAD
TECHNIQUE: Bilateral screening digital craniocaudal and mediolateral oblique
mammograms were obtained. Bilateral screening digital breast
tomosynthesis was performed. The images were evaluated with
computer-aided detection.

[L MLO synth-2D]
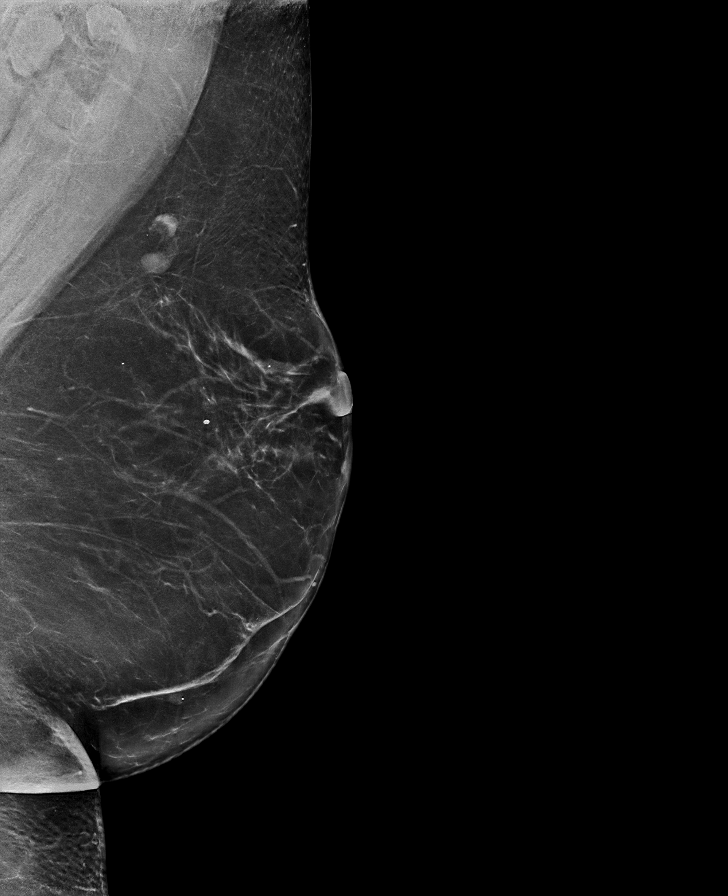

[R MLO synth-2D]
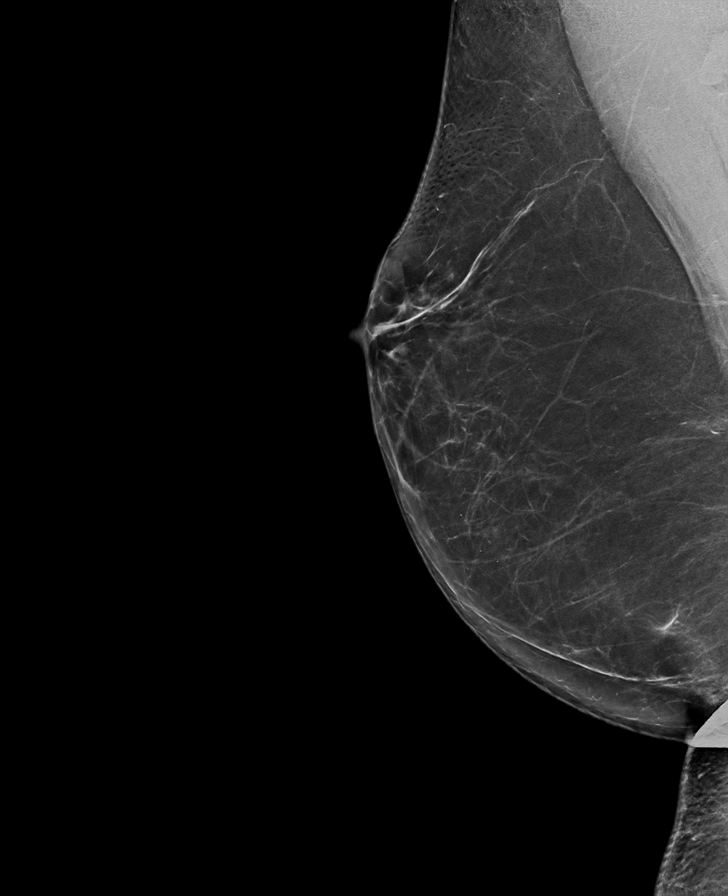

[L CC synth-2D]
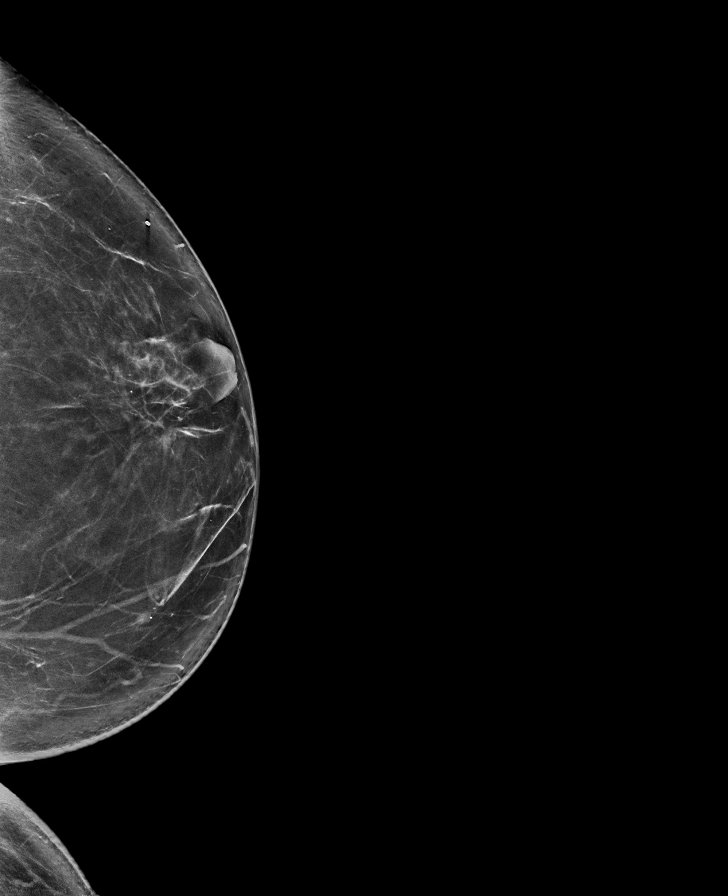

[R CC synth-2D]
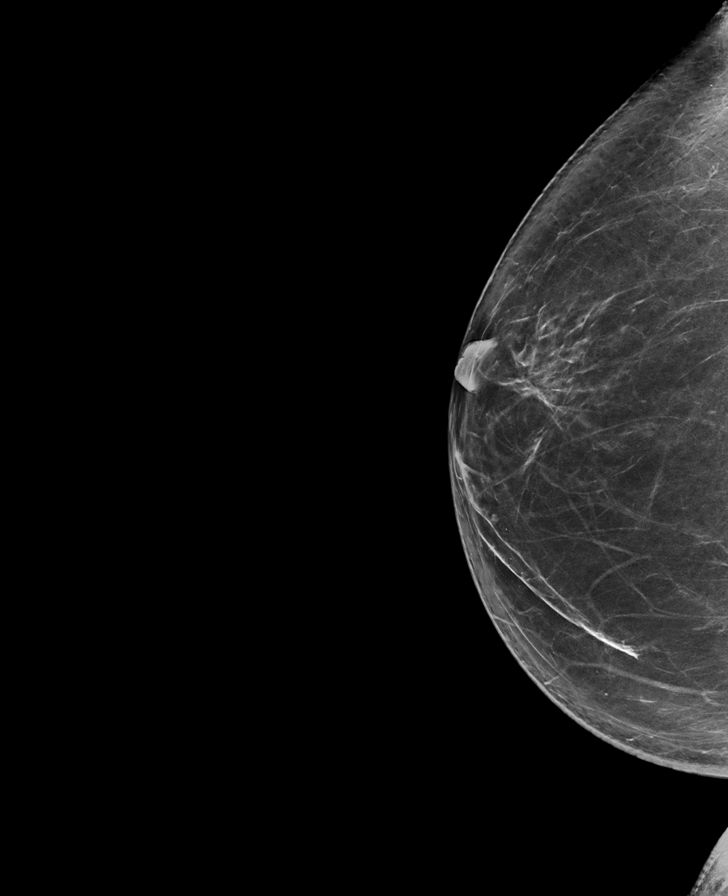

[L CC tomo · tomo slice 41/82.0]
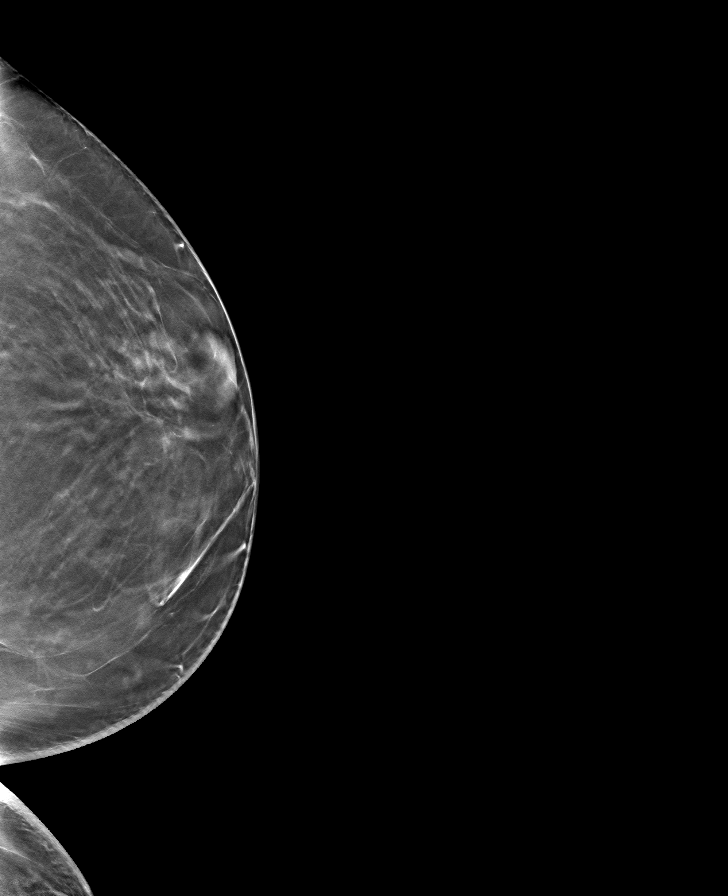

[R MLO tomo · tomo slice 41/82.0]
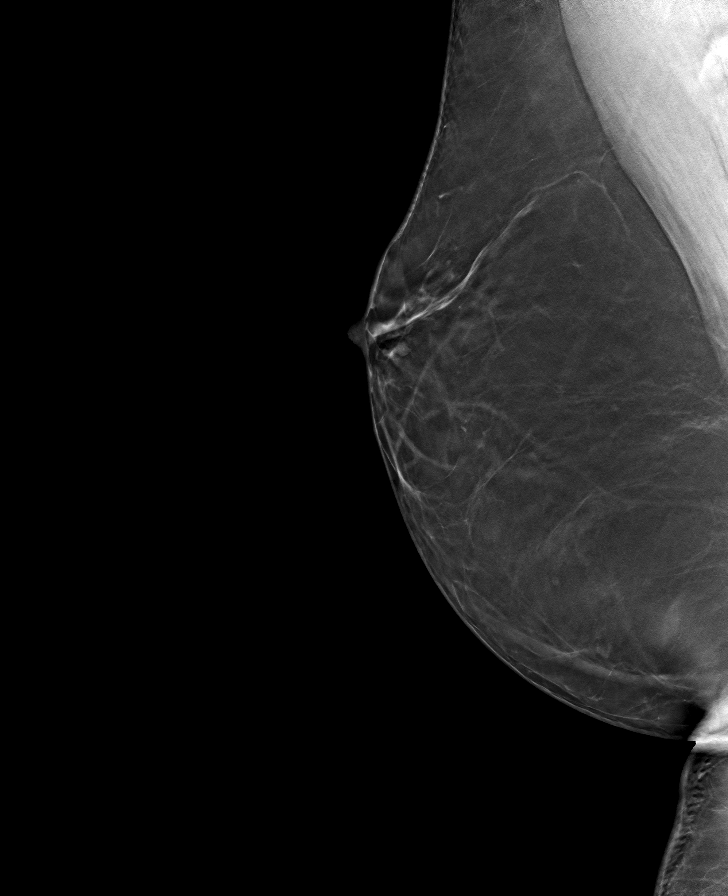

[R CC tomo · tomo slice 43/84.0]
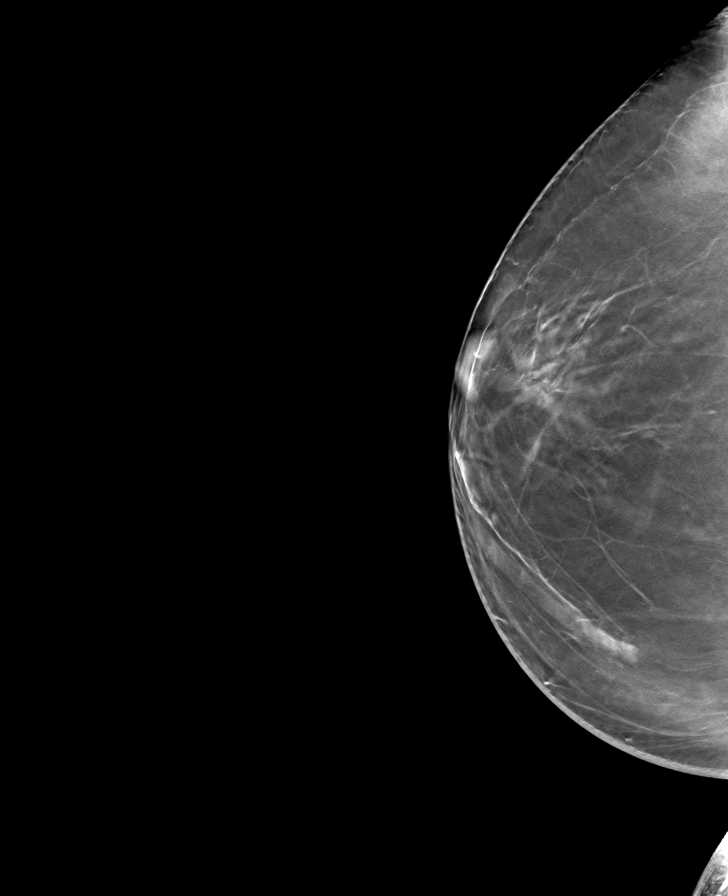

[L MLO tomo · tomo slice 45/88.0]
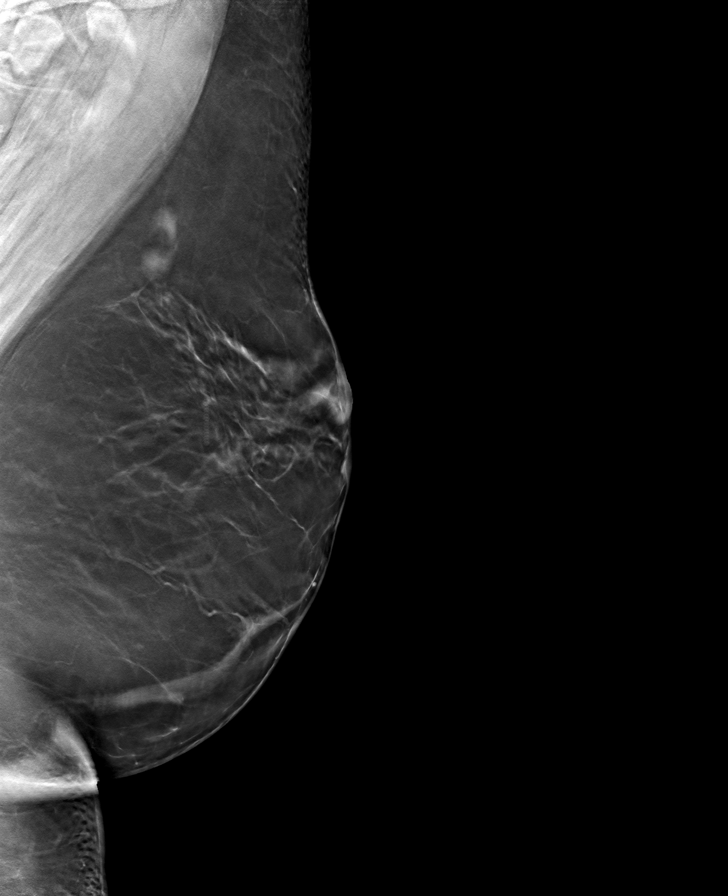

[8 of 24 positions shown; findings below may reference images not displayed]

ACR Breast Density Category b: There are scattered areas of
fibroglandular density.
FINDINGS: There are no findings suspicious for malignancy.
IMPRESSION: No mammographic evidence of malignancy. A result letter of this
screening mammogram will be mailed directly to the patient.

RECOMMENDATION:
Screening mammogram in one year. (Code:51-O-LD2)

BI-RADS CATEGORY  1: Negative.

## 2022-09-15 DIAGNOSIS — R202 Paresthesia of skin: Secondary | ICD-10-CM | POA: Diagnosis not present

## 2022-09-15 DIAGNOSIS — R200 Anesthesia of skin: Secondary | ICD-10-CM | POA: Diagnosis not present

## 2022-09-23 DIAGNOSIS — W540XXA Bitten by dog, initial encounter: Secondary | ICD-10-CM | POA: Diagnosis not present

## 2022-09-23 DIAGNOSIS — S61250A Open bite of right index finger without damage to nail, initial encounter: Secondary | ICD-10-CM | POA: Diagnosis not present

## 2022-09-23 DIAGNOSIS — Z6833 Body mass index (BMI) 33.0-33.9, adult: Secondary | ICD-10-CM | POA: Diagnosis not present

## 2022-09-23 DIAGNOSIS — E119 Type 2 diabetes mellitus without complications: Secondary | ICD-10-CM | POA: Diagnosis not present

## 2022-11-04 DIAGNOSIS — Z0000 Encounter for general adult medical examination without abnormal findings: Secondary | ICD-10-CM | POA: Diagnosis not present

## 2022-11-04 DIAGNOSIS — E119 Type 2 diabetes mellitus without complications: Secondary | ICD-10-CM | POA: Diagnosis not present

## 2022-11-04 DIAGNOSIS — E7800 Pure hypercholesterolemia, unspecified: Secondary | ICD-10-CM | POA: Diagnosis not present

## 2022-11-04 DIAGNOSIS — R8290 Unspecified abnormal findings in urine: Secondary | ICD-10-CM | POA: Diagnosis not present

## 2022-11-11 DIAGNOSIS — F325 Major depressive disorder, single episode, in full remission: Secondary | ICD-10-CM | POA: Diagnosis not present

## 2022-11-11 DIAGNOSIS — E119 Type 2 diabetes mellitus without complications: Secondary | ICD-10-CM | POA: Diagnosis not present

## 2022-11-11 DIAGNOSIS — F5104 Psychophysiologic insomnia: Secondary | ICD-10-CM | POA: Diagnosis not present

## 2022-11-11 DIAGNOSIS — Z0000 Encounter for general adult medical examination without abnormal findings: Secondary | ICD-10-CM | POA: Diagnosis not present

## 2022-11-11 DIAGNOSIS — E7800 Pure hypercholesterolemia, unspecified: Secondary | ICD-10-CM | POA: Diagnosis not present

## 2022-12-01 ENCOUNTER — Other Ambulatory Visit: Payer: Self-pay | Admitting: Family Medicine

## 2022-12-01 DIAGNOSIS — Z1231 Encounter for screening mammogram for malignant neoplasm of breast: Secondary | ICD-10-CM

## 2022-12-02 ENCOUNTER — Ambulatory Visit
Admission: RE | Admit: 2022-12-02 | Discharge: 2022-12-02 | Disposition: A | Discharge status: Home / Self Care | Payer: 59 | Source: Ambulatory Visit | Attending: Family Medicine | Admitting: Family Medicine

## 2022-12-02 DIAGNOSIS — Z1231 Encounter for screening mammogram for malignant neoplasm of breast: Secondary | ICD-10-CM | POA: Diagnosis not present

## 2023-03-08 DIAGNOSIS — Z1211 Encounter for screening for malignant neoplasm of colon: Secondary | ICD-10-CM | POA: Diagnosis not present

## 2023-04-11 ENCOUNTER — Ambulatory Visit: Payer: Self-pay

## 2023-04-11 DIAGNOSIS — Z1211 Encounter for screening for malignant neoplasm of colon: Secondary | ICD-10-CM | POA: Diagnosis present

## 2023-04-11 DIAGNOSIS — K5730 Diverticulosis of large intestine without perforation or abscess without bleeding: Secondary | ICD-10-CM | POA: Diagnosis not present

## 2023-04-11 DIAGNOSIS — K640 First degree hemorrhoids: Secondary | ICD-10-CM | POA: Diagnosis not present

## 2024-01-17 ENCOUNTER — Other Ambulatory Visit: Payer: Self-pay | Admitting: Family Medicine

## 2024-01-17 ENCOUNTER — Other Ambulatory Visit: Payer: Self-pay | Admitting: Orthopedic Surgery

## 2024-01-17 DIAGNOSIS — M4807 Spinal stenosis, lumbosacral region: Secondary | ICD-10-CM

## 2024-01-17 DIAGNOSIS — M51372 Other intervertebral disc degeneration, lumbosacral region with discogenic back pain and lower extremity pain: Secondary | ICD-10-CM

## 2024-01-17 DIAGNOSIS — M48062 Spinal stenosis, lumbar region with neurogenic claudication: Secondary | ICD-10-CM

## 2024-01-17 DIAGNOSIS — Z1231 Encounter for screening mammogram for malignant neoplasm of breast: Secondary | ICD-10-CM

## 2024-01-18 ENCOUNTER — Encounter: Payer: Self-pay | Admitting: Orthopedic Surgery

## 2024-01-21 ENCOUNTER — Inpatient Hospital Stay
Admission: RE | Admit: 2024-01-21 | Discharge: 2024-01-21 | Attending: Orthopedic Surgery | Admitting: Orthopedic Surgery

## 2024-01-21 DIAGNOSIS — M51372 Other intervertebral disc degeneration, lumbosacral region with discogenic back pain and lower extremity pain: Secondary | ICD-10-CM

## 2024-01-21 DIAGNOSIS — M4807 Spinal stenosis, lumbosacral region: Secondary | ICD-10-CM

## 2024-01-21 DIAGNOSIS — M48062 Spinal stenosis, lumbar region with neurogenic claudication: Secondary | ICD-10-CM

## 2024-02-21 ENCOUNTER — Ambulatory Visit
Admission: RE | Admit: 2024-02-21 | Discharge: 2024-02-21 | Disposition: A | Discharge status: Home / Self Care | Source: Ambulatory Visit | Attending: Family Medicine | Admitting: Family Medicine

## 2024-02-21 DIAGNOSIS — Z1231 Encounter for screening mammogram for malignant neoplasm of breast: Secondary | ICD-10-CM | POA: Insufficient documentation

## 2024-02-22 ENCOUNTER — Other Ambulatory Visit: Payer: Self-pay | Admitting: Family Medicine

## 2024-02-22 DIAGNOSIS — N644 Mastodynia: Secondary | ICD-10-CM

## 2024-02-22 DIAGNOSIS — N6451 Induration of breast: Secondary | ICD-10-CM

## 2024-02-27 ENCOUNTER — Ambulatory Visit
Admission: RE | Admit: 2024-02-27 | Discharge: 2024-02-27 | Disposition: A | Source: Ambulatory Visit | Attending: Family Medicine | Admitting: Family Medicine

## 2024-02-27 ENCOUNTER — Ambulatory Visit
Admission: RE | Admit: 2024-02-27 | Discharge: 2024-02-27 | Disposition: A | Discharge status: Home / Self Care | Source: Ambulatory Visit | Attending: Family Medicine | Admitting: Family Medicine

## 2024-02-27 DIAGNOSIS — N644 Mastodynia: Secondary | ICD-10-CM | POA: Diagnosis present

## 2024-02-27 DIAGNOSIS — N6451 Induration of breast: Secondary | ICD-10-CM | POA: Insufficient documentation
